# Patient Record
Sex: Male | Born: 1970 | Race: White | Hispanic: No | Marital: Married | Smoking: Current every day smoker
Health system: Southern US, Community
[De-identification: ages and names within clinical notes are randomized; demographics above are authoritative.]

## PROBLEM LIST (undated history)

## (undated) DIAGNOSIS — R55 Syncope and collapse: Secondary | ICD-10-CM

## (undated) DIAGNOSIS — R569 Unspecified convulsions: Secondary | ICD-10-CM

## (undated) DIAGNOSIS — G2581 Restless legs syndrome: Secondary | ICD-10-CM

## (undated) DIAGNOSIS — R001 Bradycardia, unspecified: Secondary | ICD-10-CM

## (undated) DIAGNOSIS — I10 Essential (primary) hypertension: Secondary | ICD-10-CM

## (undated) DIAGNOSIS — Z720 Tobacco use: Secondary | ICD-10-CM

## (undated) HISTORY — PX: OTHER SURGICAL HISTORY: SHX169

---

## 2013-07-18 ENCOUNTER — Encounter (HOSPITAL_COMMUNITY): Payer: Self-pay | Admitting: Emergency Medicine

## 2013-07-18 ENCOUNTER — Inpatient Hospital Stay (HOSPITAL_COMMUNITY)
Admission: EM | Admit: 2013-07-18 | Discharge: 2013-07-22 | DRG: 312 | Disposition: A | Discharge status: Home / Self Care | Payer: BC Managed Care – PPO | Attending: Internal Medicine | Admitting: Internal Medicine

## 2013-07-18 ENCOUNTER — Observation Stay (HOSPITAL_COMMUNITY): Payer: BC Managed Care – PPO

## 2013-07-18 ENCOUNTER — Emergency Department (HOSPITAL_COMMUNITY): Payer: BC Managed Care – PPO

## 2013-07-18 DIAGNOSIS — Z833 Family history of diabetes mellitus: Secondary | ICD-10-CM

## 2013-07-18 DIAGNOSIS — G2581 Restless legs syndrome: Secondary | ICD-10-CM | POA: Diagnosis present

## 2013-07-18 DIAGNOSIS — R109 Unspecified abdominal pain: Secondary | ICD-10-CM | POA: Diagnosis present

## 2013-07-18 DIAGNOSIS — I10 Essential (primary) hypertension: Secondary | ICD-10-CM

## 2013-07-18 DIAGNOSIS — I519 Heart disease, unspecified: Secondary | ICD-10-CM | POA: Diagnosis present

## 2013-07-18 DIAGNOSIS — R569 Unspecified convulsions: Secondary | ICD-10-CM

## 2013-07-18 DIAGNOSIS — R4789 Other speech disturbances: Secondary | ICD-10-CM

## 2013-07-18 DIAGNOSIS — W19XXXA Unspecified fall, initial encounter: Secondary | ICD-10-CM | POA: Diagnosis present

## 2013-07-18 DIAGNOSIS — R4182 Altered mental status, unspecified: Secondary | ICD-10-CM

## 2013-07-18 DIAGNOSIS — I517 Cardiomegaly: Secondary | ICD-10-CM

## 2013-07-18 DIAGNOSIS — G4733 Obstructive sleep apnea (adult) (pediatric): Secondary | ICD-10-CM

## 2013-07-18 DIAGNOSIS — F17210 Nicotine dependence, cigarettes, uncomplicated: Secondary | ICD-10-CM | POA: Diagnosis present

## 2013-07-18 DIAGNOSIS — Z7982 Long term (current) use of aspirin: Secondary | ICD-10-CM

## 2013-07-18 DIAGNOSIS — Z8249 Family history of ischemic heart disease and other diseases of the circulatory system: Secondary | ICD-10-CM

## 2013-07-18 DIAGNOSIS — F17200 Nicotine dependence, unspecified, uncomplicated: Secondary | ICD-10-CM | POA: Diagnosis present

## 2013-07-18 DIAGNOSIS — Z79899 Other long term (current) drug therapy: Secondary | ICD-10-CM

## 2013-07-18 DIAGNOSIS — S022XXA Fracture of nasal bones, initial encounter for closed fracture: Secondary | ICD-10-CM

## 2013-07-18 DIAGNOSIS — R404 Transient alteration of awareness: Secondary | ICD-10-CM | POA: Diagnosis present

## 2013-07-18 DIAGNOSIS — R55 Syncope and collapse: Principal | ICD-10-CM | POA: Diagnosis present

## 2013-07-18 DIAGNOSIS — Z823 Family history of stroke: Secondary | ICD-10-CM

## 2013-07-18 DIAGNOSIS — S022XXB Fracture of nasal bones, initial encounter for open fracture: Secondary | ICD-10-CM

## 2013-07-18 DIAGNOSIS — G9389 Other specified disorders of brain: Secondary | ICD-10-CM | POA: Diagnosis present

## 2013-07-18 DIAGNOSIS — I498 Other specified cardiac arrhythmias: Secondary | ICD-10-CM | POA: Diagnosis present

## 2013-07-18 HISTORY — DX: Bradycardia, unspecified: R00.1

## 2013-07-18 HISTORY — DX: Tobacco use: Z72.0

## 2013-07-18 HISTORY — DX: Syncope and collapse: R55

## 2013-07-18 HISTORY — DX: Morbid (severe) obesity due to excess calories: E66.01

## 2013-07-18 HISTORY — DX: Unspecified convulsions: R56.9

## 2013-07-18 HISTORY — DX: Essential (primary) hypertension: I10

## 2013-07-18 HISTORY — DX: Restless legs syndrome: G25.81

## 2013-07-18 LAB — LIPASE, BLOOD: Lipase: 26 U/L (ref 11–59)

## 2013-07-18 LAB — URINALYSIS, ROUTINE W REFLEX MICROSCOPIC
Bilirubin Urine: NEGATIVE
Glucose, UA: NEGATIVE mg/dL
Hgb urine dipstick: NEGATIVE
Leukocytes, UA: NEGATIVE
Specific Gravity, Urine: 1.031 — ABNORMAL HIGH (ref 1.005–1.030)
Urobilinogen, UA: 1 mg/dL (ref 0.0–1.0)
pH: 6.5 (ref 5.0–8.0)

## 2013-07-18 LAB — COMPREHENSIVE METABOLIC PANEL
BUN: 15 mg/dL (ref 6–23)
CO2: 24 mEq/L (ref 19–32)
Calcium: 8.7 mg/dL (ref 8.4–10.5)
Chloride: 103 mEq/L (ref 96–112)
Creatinine, Ser: 0.85 mg/dL (ref 0.50–1.35)
GFR calc Af Amer: >90 mL/min (ref >90)
GFR calc non Af Amer: >90 mL/min (ref >90)
Glucose, Bld: 105 mg/dL — ABNORMAL HIGH (ref 70–99)
Sodium: 138 mEq/L (ref 135–145)
Total Bilirubin: 0.6 mg/dL (ref 0.3–1.2)

## 2013-07-18 LAB — DIFFERENTIAL
Eosinophils Relative: 3 % (ref 0–5)
Lymphocytes Relative: 23 % (ref 12–46)
Monocytes Absolute: 0.8 10*3/uL (ref 0.1–1.0)
Monocytes Relative: 10 % (ref 3–12)
Neutro Abs: 4.9 10*3/uL (ref 1.7–7.7)
Neutrophils Relative %: 64 % (ref 43–77)

## 2013-07-18 LAB — CBC
HCT: 46.8 % (ref 39.0–52.0)
Hemoglobin: 16 g/dL (ref 13.0–17.0)
MCV: 88.3 fL (ref 78.0–100.0)
RDW: 13 % (ref 11.5–15.5)
WBC: 7.6 10*3/uL (ref 4.0–10.5)

## 2013-07-18 LAB — TROPONIN I
Troponin I: <0.3 ng/mL (ref <0.30)
Troponin I: <0.3 ng/mL (ref <0.30)
Troponin I: <0.3 ng/mL (ref <0.30)

## 2013-07-18 LAB — POCT I-STAT TROPONIN I: Troponin i, poc: 0 ng/mL (ref 0.00–0.08)

## 2013-07-18 LAB — PROTIME-INR
INR: 0.99 (ref 0.00–1.49)
Prothrombin Time: 12.9 seconds (ref 11.6–15.2)

## 2013-07-18 LAB — POCT I-STAT, CHEM 8
Chloride: 103 mEq/L (ref 96–112)
Creatinine, Ser: 0.9 mg/dL (ref 0.50–1.35)
Glucose, Bld: 108 mg/dL — ABNORMAL HIGH (ref 70–99)
Hemoglobin: 16.3 g/dL (ref 13.0–17.0)
Potassium: 4 mEq/L (ref 3.5–5.1)
Sodium: 139 mEq/L (ref 135–145)

## 2013-07-18 LAB — GLUCOSE, CAPILLARY: Glucose-Capillary: 99 mg/dL (ref 70–99)

## 2013-07-18 LAB — HEMOGLOBIN A1C
Hgb A1c MFr Bld: 5.7 % — ABNORMAL HIGH (ref <5.7)
Mean Plasma Glucose: 117 mg/dL — ABNORMAL HIGH (ref <117)

## 2013-07-18 LAB — ETHANOL: Alcohol, Ethyl (B): <11 mg/dL (ref 0–11)

## 2013-07-18 LAB — APTT: aPTT: 28 seconds (ref 24–37)

## 2013-07-18 LAB — RAPID URINE DRUG SCREEN, HOSP PERFORMED
Cocaine: NOT DETECTED
Opiates: NOT DETECTED
Tetrahydrocannabinol: NOT DETECTED

## 2013-07-18 LAB — D-DIMER, QUANTITATIVE: D-Dimer, Quant: 0.83 ug/mL-FEU — ABNORMAL HIGH (ref 0.00–0.48)

## 2013-07-18 MED ORDER — LEVETIRACETAM 500 MG PO TABS
500.0000 mg | ORAL_TABLET | Freq: Two times a day (BID) | ORAL | Status: DC
  Filled 2013-07-18 (×2): qty 1

## 2013-07-18 MED ORDER — PANTOPRAZOLE SODIUM 40 MG PO TBEC
40.0000 mg | DELAYED_RELEASE_TABLET | Freq: Every day | ORAL | Status: DC
  Administered 2013-07-19 – 2013-07-22 (×4): 40 mg via ORAL
  Filled 2013-07-18 (×3): qty 1

## 2013-07-18 MED ORDER — HYDROMORPHONE HCL PF 1 MG/ML IJ SOLN: 1.0000 mg | Freq: Once | INTRAMUSCULAR | Status: DC

## 2013-07-18 MED ORDER — IOHEXOL 300 MG/ML  SOLN
100.0000 mL | Freq: Once | INTRAMUSCULAR | Status: AC | PRN
  Administered 2013-07-18: 100 mL via INTRAVENOUS

## 2013-07-18 MED ORDER — LEVETIRACETAM 500 MG/5ML IV SOLN
1000.0000 mg | INTRAVENOUS | Status: AC
  Administered 2013-07-18: 1000 mg via INTRAVENOUS
  Filled 2013-07-18: qty 10

## 2013-07-18 MED ORDER — WHITE PETROLATUM GEL
Status: AC
  Administered 2013-07-18: 0.2
  Filled 2013-07-18: qty 5

## 2013-07-18 MED ORDER — ONDANSETRON HCL 4 MG/2ML IJ SOLN
4.0000 mg | Freq: Once | INTRAMUSCULAR | Status: AC
  Administered 2013-07-18: 4 mg via INTRAVENOUS
  Filled 2013-07-18: qty 2

## 2013-07-18 MED ORDER — KETOROLAC TROMETHAMINE 30 MG/ML IJ SOLN
30.0000 mg | Freq: Once | INTRAMUSCULAR | Status: AC
  Administered 2013-07-18: 30 mg via INTRAVENOUS
  Filled 2013-07-18: qty 1

## 2013-07-18 MED ORDER — ASPIRIN EC 81 MG PO TBEC
81.0000 mg | DELAYED_RELEASE_TABLET | Freq: Every day | ORAL | Status: DC
  Administered 2013-07-19 – 2013-07-22 (×4): 81 mg via ORAL
  Filled 2013-07-18 (×5): qty 1

## 2013-07-18 MED ORDER — MORPHINE SULFATE 2 MG/ML IJ SOLN
2.0000 mg | INTRAMUSCULAR | Status: DC | PRN
  Administered 2013-07-18 – 2013-07-22 (×23): 2 mg via INTRAVENOUS
  Filled 2013-07-18 (×23): qty 1

## 2013-07-18 MED ORDER — TETANUS-DIPHTH-ACELL PERTUSSIS 5-2.5-18.5 LF-MCG/0.5 IM SUSP
0.5000 mL | Freq: Once | INTRAMUSCULAR | Status: AC
  Administered 2013-07-18: 0.5 mL via INTRAMUSCULAR
  Filled 2013-07-18: qty 0.5

## 2013-07-18 MED ORDER — LEVETIRACETAM 750 MG PO TABS
750.0000 mg | ORAL_TABLET | Freq: Two times a day (BID) | ORAL | Status: DC
  Administered 2013-07-18 – 2013-07-19 (×2): 750 mg via ORAL
  Filled 2013-07-18 (×2): qty 1

## 2013-07-18 NOTE — Code Documentation (Signed)
42 yo wm brought in via [facility] for s/p fall & slurred speech.  Per report pt was smoking at work with a coworker when he had a witnessed fall from a seated position onto his face.  Pt was awake by EMS arrival with slurred speech & facial trauma.  On arrival to ED pt was alert & able to follow commands & MAE.  Pt rapidly improved in the scanner & was able to answer questions MAEE. NIH 3 for mild aphasia.  Pt is with complaints of abd pain & has a remote hx of seizures.  No acute treatment due to mild & improving symptoms.  See docflowsheets for times

## 2013-07-18 NOTE — ED Notes (Signed)
Spoke with wife and she requested that pt not be given narcotics (per pt request) d/t drug abuse in the past.

## 2013-07-18 NOTE — ED Notes (Signed)
Pt is trying to give a urine sample at this time

## 2013-07-18 NOTE — Procedures (Signed)
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   Sedation: None  Background: The preponderance of this study is drowsiness and sleep. Sleep structures appear normal and symmetrical. There are frequent positive occipital sharp transients of sleep(POSTS) that are a normal variant. Once roused, There is a well defined posterior dominant rhythm of 9 Hz.  Photic stimulation: Physiologic driving is not performed  EEG Abnormalities: None  Clinical Interpretation: This normal EEG is recorded in the waking and sleep state. There was no seizure or seizure predisposition recorded on this study.   Ritta Slot, MD [facility] [phone]  If 7pm- 7am, please page neurology on call at [phone].

## 2013-07-18 NOTE — Progress Notes (Signed)
VASCULAR LAB PRELIMINARY  PRELIMINARY  PRELIMINARY  PRELIMINARY  Carotid duplex  completed.    Preliminary report:  Bilateral:  1-39% ICA stenosis.  Vertebral artery flow is antegrade.      Xander Jutras, RVT 07/18/2013, 10:38 AM
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
## 2013-07-18 NOTE — ED Provider Notes (Signed)
CSN: [id number]     Arrival date & time 07/18/13  4098 History   First MD Initiated Contact with Patient 07/18/13 [phone]     Chief Complaint  Patient presents with  . Code Stroke   (Consider location/radiation/quality/duration/timing/severity/associated sxs/prior Treatment) HPI Comments: At work and fall. Possible seizure. No history provided by patient.   Patient is a 42 y.o. male presenting with fall. The history is provided by the patient.  Fall This is a new problem. The current episode started 1 to 2 hours ago. The problem occurs constantly. The problem has not changed since onset.Associated symptoms include abdominal pain (since yesterday) and headaches (since yesterday). Pertinent negatives include no chest pain and no shortness of breath. Nothing aggravates the symptoms. Nothing relieves the symptoms. He has tried nothing for the symptoms.    History reviewed. No pertinent past medical history. History reviewed. No pertinent past surgical history. No family history on file. History  Substance Use Topics  . Smoking status: Not on file  . Smokeless tobacco: Not on file  . Alcohol Use: Not on file    Review of Systems  Constitutional: Negative for fever.  Respiratory: Negative for shortness of breath.   Cardiovascular: Negative for chest pain.  Gastrointestinal: Positive for abdominal pain (since yesterday).  Neurological: Positive for headaches (since yesterday).  All other systems reviewed and are negative.    Allergies  Review of patient's allergies indicates not on file.  Home Medications  No current outpatient prescriptions on file. BP 150/105  Pulse 69  Temp(Src) 98.1 F (36.7 C) (Oral)  Resp 26  SpO2 97% Physical Exam  Nursing note and vitals reviewed. Constitutional: He is oriented to person, place, and time. He appears well-developed and well-nourished. No distress.  HENT:  Head: Normocephalic.  Nose: Sinus tenderness present. No septal deviation or  nasal septal hematoma. No epistaxis.  No foreign bodies. Right sinus exhibits maxillary sinus tenderness and frontal sinus tenderness. Left sinus exhibits frontal sinus tenderness.    Mouth/Throat: No oropharyngeal exudate.  Eyes: EOM are normal. Pupils are equal, round, and reactive to light.  Neck: Normal range of motion. Neck supple.  Cardiovascular: Normal rate and regular rhythm.  Exam reveals no friction rub.   No murmur heard. Pulmonary/Chest: Effort normal and breath sounds normal. No respiratory distress. He has no wheezes. He has no rales.  Abdominal: Soft. He exhibits no distension. There is tenderness (epigastric). There is no rebound.  Musculoskeletal: Normal range of motion. He exhibits no edema.  Neurological: He is alert and oriented to person, place, and time. No cranial nerve deficit. He exhibits normal muscle tone. Coordination normal.  Skin: Skin is dry. He is not diaphoretic. No erythema.    ED Course  Procedures (including critical care time) Labs Review Labs Reviewed  POCT I-STAT, CHEM 8 - Abnormal; Notable for the following:    Glucose, Bld 108 (*)    All other components within normal limits  PROTIME-INR  APTT  CBC  DIFFERENTIAL  ETHANOL  COMPREHENSIVE METABOLIC PANEL  TROPONIN I  URINE RAPID DRUG SCREEN (HOSP PERFORMED)  URINALYSIS, ROUTINE W REFLEX MICROSCOPIC  LIPASE, BLOOD  POCT I-STAT TROPONIN I   Imaging Review No results found.  EKG Interpretation    Date/Time:  Wednesday July 18 2013 06:56:45 EST Ventricular Rate:  73 PR Interval:  178 QRS Duration: 97 QT Interval:  371 QTC Calculation: 409 R Axis:   59 Text Interpretation:  Sinus rhythm Borderline T abnormalities, inferior leads No prior EKG to  compare Confirmed by [facility]  MD, Mavrick Mcquigg ([id number]) on 07/18/2013 8:40:42 AM            MDM   1. Syncope    89M s/p possible syncopal vs seizure episode. He fell onto his face. He head unilateral weakness and slurred speech, thus was  made a Code Stroke.  Patient reports abdominal pain, epigastric,sharp, nonradiating, for the past day. He has also had a headache for the past day.  Stroke team had evaluated patient upon my arrival. They believe he is a NIH stroke scale of 3-4 initially that is improving, but with possible seizure/syncope with this, no intervention is required at this time. He reports a seizure previously, but isn't taking any medications at this time and hasn't for years because he felt the meds made him have seizures. AFVSS here. Relaxing comfortably. Epigastric pain on exam, no rebound or guarding, no other abdominal pain. Large abrasion over nose. Midface stable, but tender.  Will scan face, neck, head. Labs sent. Will send for CT Abd/Pelvis. Neuro wants to admit for possible TIA/seizure workup.  CT Face shows nasal fractures.    Dagmar Hait, MD 07/18/13 [phone]

## 2013-07-18 NOTE — Progress Notes (Signed)
Called at 1645 to assist with patient with period of unresponsivness.  On arrival patient alert - oriented - mae - slight right side weakness - otherwise at neuro baseline.  Wife states he was eating dinner and suddenly placed fork on bed - said he needed to rest - then had blank stare and fell to right side rail - again striking nose on side rail.  No signs of any tonic-clonic movemnet noted. Episode lasted about one and half minutes.  No signs incontinence.  Patient does not remember the episode.  CBG 99 BP 116/75 HR 68 SR RR 20 O2sat 100% on 2 liter nasal cannula.  Denies CP or SOB.  Patient admitted early this AM with syncopal episode at work where he fell to the ground face forward - has nasal fx and open wounds to face.  Dr. Amada Jupiter present.  Handoff to [employer] - to call as needed.  No RRT intervention needed at this time.

## 2013-07-18 NOTE — ED Notes (Signed)
Per EMS, Patient was sitting outside of work smoking a cigarette with coworkers and stopped taking mid sentence and had an episode of syncope. Patient fell and hit his head and has a laceration to the bridge of his nose. Upon EMS arrival patient was noted to have unilateral weakness and slurred speech. Patient had 1 episode of emesis. LSN 0550 CBG 95

## 2013-07-18 NOTE — Progress Notes (Signed)
Routine EEG completed.  

## 2013-07-18 NOTE — ED Notes (Addendum)
Spoke with RN Cala Bradford and she is aware that pt is at vascular and is going to EEG/MRI after vascular and before he is going to floor.  Spouse is aware.  Meds taken up by staff and handed to [employer].
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
## 2013-07-18 NOTE — Progress Notes (Signed)
Echo Lab  2D Echocardiogram completed.  Amariona Rathje L Anastasio Wogan, RDCS 07/18/2013 11:22 AM

## 2013-07-18 NOTE — Consult Note (Signed)
Referring Physician: ED    Chief Complaint: CONFUSION, SLURRED SPEECH, RIGHT FACE WEAKNESS, RIGHT GREATER THAN LEFT ARM WEAKNESS.  HPI:                                                                                                                                         Caleb Rodriguez is an 42 y.o. male right handed, with a past medical history significant for HTN, single seizure many years ago, smoker, brought to [facility] ED by ambulance as a code stroke due to the above stated symptoms. He wast last known by a coworker well at 520 am. As per EMS, patient was at work in [employer], took a break and went outside to smoke in company of a Radio broadcast assistant and then he collapsed to the floor and hit his face. Upon EMS arrival he was noted to be confused with slurred speech, right face weakness, and weakness of both arms, right greater than left. He has no recollection of this event. Initial NIHSS 3 in the ED. CT brain showed no acute ischemic or hemorrhagic abnormality but both MCA are mildly hyperdense. [name] started to improved rapidly in the ED but still disoriented to place-year-month. Denies HA, vertigo, double vision, difficulty swallowing, or visual disturbances. Date last known well: 07/18/13 Time last known well: 520 am  tPA Given: No, low NIHSS, concern for seizure. NIHSS: 3  MRS: 0  History reviewed. No pertinent past medical history.  History reviewed. No pertinent past surgical history.  No family history on file. Social History:  has no tobacco, alcohol, and drug history on file.  Allergies: Not on File  Medications:                                                                                                                           I have reviewed the patient's current medications.  ROS:  History obtained from chart  review  General ROS: negative for - chills, fatigue, fever, night sweats, weight gain or weight loss Psychological ROS: negative for - behavioral disorder, hallucinations, memory difficulties, mood swings or suicidal ideation Ophthalmic ROS: negative for - blurry vision, double vision, eye pain or loss of vision ENT ROS: negative for - epistaxis, nasal discharge, oral lesions, sore throat, tinnitus or vertigo Allergy and Immunology ROS: negative for - hives or itchy/watery eyes Hematological and Lymphatic ROS: negative for - bleeding problems, bruising or swollen lymph nodes Endocrine ROS: negative for - galactorrhea, hair pattern changes, polydipsia/polyuria or temperature intolerance Respiratory ROS: negative for - cough, hemoptysis, shortness of breath or wheezing Cardiovascular ROS: negative for - chest pain, dyspnea on exertion, edema or irregular heartbeat Gastrointestinal ROS: negative for - abdominal pain, diarrhea, hematemesis, nausea/vomiting or stool incontinence Genito-Urinary ROS: negative for - dysuria, hematuria, incontinence or urinary frequency/urgency Musculoskeletal ROS: negative for - joint swelling or muscular weakness Neurological ROS: as noted in HPI Dermatological ROS: negative for rash and skin lesion changes   Physical exam: pleasant male in no apparent distress. Blood pressure 150/105, pulse 69, temperature 98.1 F (36.7 C), temperature source Oral, resp. rate 26, SpO2 97.00%. Head: normocephalic.  Face: significant face trauma. Neck: supple, no bruits, no JVD. Cardiac: no murmurs. Lungs: clear. Abdomen: soft, no tender, no mass. Extremities: no edema.  Neurologic Examination:                                                                                                      Mental Status: Alert, awake, disoriented to place-year-month. No aphasia or Asir dysarthria.  Able to follow 3 step commands without difficulty. Cranial Nerves: II: Discs flat  bilaterally; Visual fields grossly normal, pupils equal, round, reactive to light III,IV, VI: ptosis not present, extra-ocular motions intact bilaterally V,VII: smile symmetric, facial light touch sensation normal bilaterally VIII: hearing normal bilaterally IX,X: gag reflex present XI: bilateral shoulder shrug XII: midline tongue extension without atrophy or fasciculations  Motor: Right : Upper extremity   5/5    Left:     Upper extremity   5/5  Lower extremity   5/5     Lower extremity   5/5 Tone and bulk:normal tone throughout; no atrophy noted Sensory: Pinprick and light touch intact throughout, bilaterally Deep Tendon Reflexes:  Right: Upper Extremity   Left: Upper extremity   biceps (C-5 to C-6) 2/4   biceps (C-5 to C-6) 2/4 tricep (C7) 2/4    triceps (C7) 2/4 Brachioradialis (C6) 2/4  Brachioradialis (C6) 2/4  Lower Extremity Lower Extremity  quadriceps (L-2 to L-4) 2/4   quadriceps (L-2 to L-4) 2/4 Achilles (S1) 2/4   Achilles (S1) 2/4  Plantars: Right: downgoing   Left: downgoing Cerebellar: normal finger-to-nose,  normal heel-to-shin test Gait:  No tested. CV: pulses palpable throughout    Results for orders placed during the hospital encounter of 07/18/13 (from the past 48 hour(s))  POCT I-STAT TROPONIN I     Status: None   Collection Time    07/18/13  6:32 AM  Result Value Range   Troponin i, poc 0.00  0.00 - 0.08 ng/mL   Comment 3            Comment: Due to the release kinetics of cTnI,     a negative result within the first hours     of the onset of symptoms does not rule out     myocardial infarction with certainty.     If myocardial infarction is still suspected,     repeat the test at appropriate intervals.  PROTIME-INR     Status: None   Collection Time    07/18/13  6:34 AM      Result Value Range   Prothrombin Time 12.9  11.6 - 15.2 seconds   INR 0.99  0.00 - 1.49  APTT     Status: None   Collection Time    07/18/13  6:34 AM      Result  Value Range   aPTT 28  24 - 37 seconds  CBC     Status: None   Collection Time    07/18/13  6:34 AM      Result Value Range   WBC 7.6  4.0 - 10.5 K/uL   RBC 5.30  4.22 - 5.81 MIL/uL   Hemoglobin 16.0  13.0 - 17.0 g/dL   HCT 65.7  84.6 - 96.2 %   MCV 88.3  78.0 - 100.0 fL   MCH 30.2  26.0 - 34.0 pg   MCHC 34.2  30.0 - 36.0 g/dL   RDW 95.2  84.1 - 32.4 %   Platelets 167  150 - 400 K/uL  DIFFERENTIAL     Status: None   Collection Time    07/18/13  6:34 AM      Result Value Range   Neutrophils Relative % 64  43 - 77 %   Neutro Abs 4.9  1.7 - 7.7 K/uL   Lymphocytes Relative 23  12 - 46 %   Lymphs Abs 1.7  0.7 - 4.0 K/uL   Monocytes Relative 10  3 - 12 %   Monocytes Absolute 0.8  0.1 - 1.0 K/uL   Eosinophils Relative 3  0 - 5 %   Eosinophils Absolute 0.2  0.0 - 0.7 K/uL   Basophils Relative 0  0 - 1 %   Basophils Absolute 0.0  0.0 - 0.1 K/uL  POCT I-STAT, CHEM 8     Status: Abnormal   Collection Time    07/18/13  6:34 AM      Result Value Range   Sodium 139  135 - 145 mEq/L   Comment: QA FLAGS AND/OR RANGES MODIFIED BY DEMOGRAPHIC UPDATE ON 12/24 AT 4010   Potassium 4.0  3.5 - 5.1 mEq/L   Comment: QA FLAGS AND/OR RANGES MODIFIED BY DEMOGRAPHIC UPDATE ON 12/24 AT 0634   Chloride 103  96 - 112 mEq/L   Comment: QA FLAGS AND/OR RANGES MODIFIED BY DEMOGRAPHIC UPDATE ON 12/24 AT 0634   BUN 15  6 - 23 mg/dL   Comment: QA FLAGS AND/OR RANGES MODIFIED BY DEMOGRAPHIC UPDATE ON 12/24 AT 2725   Creatinine, Ser 0.90  0.50 - 1.35 mg/dL   Comment: QA FLAGS AND/OR RANGES MODIFIED BY DEMOGRAPHIC UPDATE ON 12/24 AT 0634   Glucose, Bld 108 (*) 70 - 99 mg/dL   Comment: QA FLAGS AND/OR RANGES MODIFIED BY DEMOGRAPHIC UPDATE ON 12/24 AT 3664   Calcium, Ion 1.15  1.12 - 1.23 mmol/L   Comment: QA FLAGS AND/OR RANGES  MODIFIED BY DEMOGRAPHIC UPDATE ON 12/24 AT 0634     QA FLAGS AND/OR RANGES MODIFIED BY DEMOGRAPHIC UPDATE ON 12/24 AT 0643   TCO2 23  0 - 100 mmol/L   Comment: QA FLAGS AND/OR RANGES  MODIFIED BY DEMOGRAPHIC UPDATE ON 12/24 AT 0634   Hemoglobin 16.3  13.0 - 17.0 g/dL   Comment: QA FLAGS AND/OR RANGES MODIFIED BY DEMOGRAPHIC UPDATE ON 12/24 AT 0634   HCT 48.0  39.0 - 52.0 %   Comment: QA FLAGS AND/OR RANGES MODIFIED BY DEMOGRAPHIC UPDATE ON 12/24 AT 0634   No results found.    Assessment: 42 y.o. male brought in after collapsing at work and found to be confused afterwards, with slurred speech, right face weakness and right greater than left weakness. Initial NIHSS 3 but steadily improving. CT brain showed no acute stroke but both MCA are mildly hyperdense.  Has a history of a remote isolated seizure and can not exclude the possibility of a seizure tonight. Bilateral hyperdense MCA but NIHSS 3 don't make me think he had a hemispheric stroke in the context of major intracranial arterial involvement. Will complete stroke and seizure work up. Load with 1 gram IV keppra and continue keppra 500 mg BID thereafter. Will follow up.  Stroke Risk Factors - smoking, HTN  Wyatt Portela ,MD [facility] [phone]  07/18/2013, 7:05 AM

## 2013-07-19 ENCOUNTER — Inpatient Hospital Stay (HOSPITAL_COMMUNITY): Payer: BC Managed Care – PPO

## 2013-07-19 LAB — COMPREHENSIVE METABOLIC PANEL
Albumin: 3.2 g/dL — ABNORMAL LOW (ref 3.5–5.2)
BUN: 12 mg/dL (ref 6–23)
Calcium: 8.5 mg/dL (ref 8.4–10.5)
Chloride: 100 mEq/L (ref 96–112)
Creatinine, Ser: 0.79 mg/dL (ref 0.50–1.35)
GFR calc Af Amer: >90 mL/min (ref >90)
GFR calc non Af Amer: >90 mL/min (ref >90)
Glucose, Bld: 105 mg/dL — ABNORMAL HIGH (ref 70–99)
Total Bilirubin: 0.6 mg/dL (ref 0.3–1.2)
Total Protein: 6.1 g/dL (ref 6.0–8.3)

## 2013-07-19 LAB — CBC
Hemoglobin: 14.9 g/dL (ref 13.0–17.0)
MCV: 87.9 fL (ref 78.0–100.0)
Platelets: 171 10*3/uL (ref 150–400)
RBC: 4.96 MIL/uL (ref 4.22–5.81)
WBC: 7.8 10*3/uL (ref 4.0–10.5)

## 2013-07-19 LAB — PROTIME-INR
INR: 1.03 (ref 0.00–1.49)
Prothrombin Time: 13.3 seconds (ref 11.6–15.2)

## 2013-07-19 LAB — APTT: aPTT: 34 seconds (ref 24–37)

## 2013-07-19 LAB — LIPID PANEL: Cholesterol: 197 mg/dL (ref 0–200)

## 2013-07-19 LAB — OCCULT BLOOD X 1 CARD TO LAB, STOOL: Fecal Occult Bld: NEGATIVE

## 2013-07-19 MED ORDER — KETOROLAC TROMETHAMINE 10 MG PO TABS
10.0000 mg | ORAL_TABLET | Freq: Three times a day (TID) | ORAL | Status: DC
  Administered 2013-07-19 – 2013-07-22 (×9): 10 mg via ORAL
  Filled 2013-07-19 (×14): qty 1

## 2013-07-19 MED ORDER — IOHEXOL 350 MG/ML SOLN
75.0000 mL | Freq: Once | INTRAVENOUS | Status: AC | PRN
  Administered 2013-07-19: 75 mL via INTRAVENOUS

## 2013-07-19 MED ORDER — SODIUM CHLORIDE 0.9 % IV BOLUS (SEPSIS)
500.0000 mL | Freq: Once | INTRAVENOUS | Status: AC
  Administered 2013-07-19: 500 mL via INTRAVENOUS

## 2013-07-19 MED ORDER — LEVETIRACETAM 500 MG PO TABS
1000.0000 mg | ORAL_TABLET | Freq: Two times a day (BID) | ORAL | Status: DC
  Administered 2013-07-19 – 2013-07-21 (×4): 1000 mg via ORAL
  Filled 2013-07-19 (×5): qty 2

## 2013-07-19 NOTE — Progress Notes (Signed)
Internal Medicine Attending  Date: 07/19/2013  Patient name: Caleb Rodriguez Medical record number: [id number] Date of birth: [birth year] Age: 42 y.o. Gender: male  I saw and evaluated the patient on a.m. rounds, and discussed his care with house staff.  He is feeling somewhat better; his strength is better on the right.  CT scan of the chest showed no pulmonary embolism.  I reviewed the resident's note by Dr. Zada Girt and I agree with the resident's findings and plans as documented in his note.

## 2013-07-19 NOTE — H&P (Signed)
Please see attending admission note 07/18/2013.

## 2013-07-19 NOTE — Progress Notes (Signed)
Pt c/o tingling and progresive weakness to rt side of body, blurred vision and intermittent slurring  of speech. Pt re assessed and Dr Lavonna Monarch  Neuro on call notified. New orders written . Pt  Awake  Alert and follows command appropriately.  No seizure episode noted at present . Rn will continue to monitor patient.and maintain seizure precaution.

## 2013-07-19 NOTE — Progress Notes (Signed)
Nurses called into pt room stat as pt suddenly became verbally  unresponsive,  staring and rolling his rt fingers, then knock  off cup of ice water  onto the floor. This happened  Whiles pt was  talking with family members  at the bedside.   lasted for about 1 minute   said patient's wife. This episode was just witnessed by family members. Pt became verbally responsive and oriented to person place when RN's  came in . Vital signs taken ,WNL. MD on call made aware no new orders given.   Will continue to monitor pt

## 2013-07-19 NOTE — Progress Notes (Signed)
Subjective: No further spells  Exam: Filed Vitals:   07/19/13 0547  BP: 111/72  Pulse: 71  Temp: 98.1 F (36.7 C)  Resp: 19   Gen: In bed, NAD MS: Awake, Alert, oriented and appropriate GE:[id number]< EOMI Motor: 5/5 throughout left side and right LE. He has poor effort in RUE, 4/5 Sensory: Reports "tingly" sensation in right arm to touch.   Impression: 42 yo M with syncope as well as speel with right arm automatism and staring yesterday afternoon. 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 Following this, he was taken off of dilantin and had no further episodes x 3 years.   Possibilities at this time include recurrent seizures, syncope with subsequent recurrent psychogenic spells after the idea of seizures is brought up to him, or combination of epilepsy and psychogenic spells( I think that this possibility is very slim given that the description of his initial passing out spell does not sound very convincing for seizure, though he did have a post-ictal state)  Recommendations: 1) Continue Keppra 750mg  BID 2) Syncope workup per primary team.   Ritta Slot, MD [facility] [phone]  If 7pm- 7am, please page neurology on call at [phone].
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
## 2013-07-20 ENCOUNTER — Encounter (HOSPITAL_COMMUNITY): Payer: Self-pay | Admitting: Emergency Medicine

## 2013-07-20 DIAGNOSIS — R569 Unspecified convulsions: Secondary | ICD-10-CM

## 2013-07-20 LAB — BASIC METABOLIC PANEL
BUN: 10 mg/dL (ref 6–23)
Calcium: 8.3 mg/dL — ABNORMAL LOW (ref 8.4–10.5)
Chloride: 103 mEq/L (ref 96–112)
GFR calc Af Amer: >90 mL/min (ref >90)
GFR calc non Af Amer: >90 mL/min (ref >90)
Glucose, Bld: 105 mg/dL — ABNORMAL HIGH (ref 70–99)
Potassium: 3.8 mEq/L (ref 3.5–5.1)
Sodium: 138 mEq/L (ref 135–145)

## 2013-07-20 LAB — CBC
Hemoglobin: 14.3 g/dL (ref 13.0–17.0)
MCH: 29.9 pg (ref 26.0–34.0)
Platelets: 166 10*3/uL (ref 150–400)
RBC: 4.79 MIL/uL (ref 4.22–5.81)
WBC: 7.8 10*3/uL (ref 4.0–10.5)

## 2013-07-20 NOTE — Progress Notes (Signed)
LTM EEG started 

## 2013-07-20 NOTE — Progress Notes (Signed)
Subjective: Two spells since yesterday, one in the afternoon and one around 1 am. Both consisted of staring with right hand automatisms.   Exam: Filed Vitals:   07/20/13 0520  BP: 126/75  Pulse: 103  Temp: 97.6 F (36.4 C)  Resp: 20   Gen: In bed, NAD MS: Awake, Alert, oriented and appropriate ZO:XWRUE, EOMI Motor: 5/5 throughout left side . He has initially poor effort in RUE, intially won't lift it, but when lifted he is able to hold it aloft. He has 4/5 strength in RLE.  Once breakfast arrives, he uses it with good dexterity.  Sensory: Reports "tingly" sensation in right arm to touch.   Impression: 41 yo M with syncope as well as spells with right arm automatism and staring. 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 Following this, he was taken off of dilantin and had no further episodes x 3 years.   Possibilities at this time include recurrent seizures, syncope with subsequent recurrent psychogenic spells after the idea of seizures is brought up to him, or combination of epilepsy and psychogenic spells( I think that this possibility is very slim given that the description of his initial passing out spell does not sound very convincing for seizure, though he did have a post-ictal state)  Recommendations: 1) Continue Keppra 1000mg  BID 2) Would be very useful to capture one of these episodes on EEG.   Ritta Slot, MD [facility] [phone]  If 7pm- 7am, please page neurology on call at [phone].

## 2013-07-20 NOTE — Progress Notes (Signed)
MD on call for Internal medicine  teaching service  came to see pt but no new orders given.

## 2013-07-20 NOTE — Progress Notes (Signed)
Nurse received a call from CCMD that at 0415 am patient changed from sinus brady heart rate 50-40s  to junctional with heart rate 70. Patient was asleep when aroused patient was alert and oriented x 4 with no complaints of pain/discomfort. Patient stated heart rate normally drops while asleep, and he was sleep apnea as well. Patient continues that he has never worn a C-pap. Notified MD of the change in rhythm, no new orders given at this time. Will continue to monitor patient.

## 2013-07-20 NOTE — Progress Notes (Signed)
UR complete.  Makena Mcgrady RN, MSN 

## 2013-07-20 NOTE — Progress Notes (Signed)
Pt had another episode of seizure which lasted for 1 1/2 minutes. Pt was eating and suddenly stopped and began starring and rolling the rt thumb and index finger per spouse. HOB  lowered and  Pt turned on the rt side as RN walked into the room . RN noted jerking movement of the RUA with vomited food particles around pt.mouth  No bladder nor bowel incontinent noted. Pt open his eye and started talking with speech being  slurred and gabbled,c/o fatigue post ictal .Pt suctioned orally and made comfortable in bed vital signs WNL see DocFowsheet. Slurring of speech resolve five minutes after the episode . Md notified.

## 2013-07-20 NOTE — Progress Notes (Signed)
Nurse assisted patient into bathroom, and back into bed. Nurse left the room and on arriving patient found unresponsive, staring, and rolling right index and thumb together. This lasted for approximately 12 seconds. Vital signs 138/80, 60, 99% on 2.5 L of O 2. Patient started to respond with slurred speech and right side continues to be weaker than left. Approximately 1 minute later patient speech clear, and orientation x 4. Notified neurologist Roseanne Reno, MD- no new orders given at this time. Will continue to monitor patient.

## 2013-07-20 NOTE — Progress Notes (Signed)
Subjective: No complaints this morning from Patient but overnight, while asleep, pt had episodes of bradycardia and junctional rthythm, but remained completely asymptomatic. Also noted by nurse at 4.30pm yesterday and 1.56pm yesterday, pt had some episodes where he was unresponsive, staring, rolling right index and thumb together. Wife says pt has been having some periods of not breathing while asleep, and he makes a lot of snoring noise while sleeping. She has noticed this since they got married a year ago. Pt says though that he doesn't feel tired when he wakes up in the morning, but falls asleep a lot while watching TV, but not while driving.   Objective: Vital signs in last 24 hours: Filed Vitals:   07/19/13 2030 07/20/13 0045 07/20/13 0140 07/20/13 0520  BP: 135/75 115/72 138/80 126/75  Pulse: 57 61 60 103  Temp: 98.9 F (37.2 C) 97.5 F (36.4 C)  97.6 F (36.4 C)  TempSrc: Oral Oral  Oral  Resp: 20 18  20   Weight:    264 lb 5.3 oz (119.9 kg)  SpO2: 97% 94% 99% 97%   Weight change:   Intake/Output Summary (Last 24 hours) at 07/20/13 1208 Last data filed at 07/20/13 0900  Gross per 24 hour  Intake    900 ml  Output    326 ml  Net    574 ml   General appearance: alert, cooperative, appears stated age and no distress, eating breakfast. Head: Normocephalic, without obvious abnormality Nose: Nose appears reddish from previous trauma, Nares normal. Septum appears midline. Mucosa normal. No drainage or sinus tenderness. Lungs: clear to auscultation bilaterally Heart: regular rate and rhythm, S1, S2 normal, no murmur, click, rub or gallop Abdomen: soft, minimal tenderness; bowel sounds normal; no masses,  no organomegaly Extremities: extremities normal, atraumatic, no cyanosis or edema Pulses: 2+ and symmetric Neurologic- Mild weakness on the right side, but finger to nose test and rapidly alternating movement normal on the Rt.  Lab Results: Basic Metabolic Panel:  Recent  Labs Lab 07/19/13 0650 07/20/13 0355  NA 136 138  K 3.7 3.8  CL 100 103  CO2 24 24  GLUCOSE 105* 105*  BUN 12 10  CREATININE 0.79 0.88  CALCIUM 8.5 8.3*   Liver Function Tests:  Recent Labs Lab 07/18/13 0634 07/19/13 0650  AST 19 14  ALT 31 23  ALKPHOS 81 75  BILITOT 0.6 0.6  PROT 6.6 6.1  ALBUMIN 3.7 3.2*    Recent Labs Lab 07/18/13 0634  LIPASE 26   CBC:  Recent Labs Lab 07/18/13 0634 07/19/13 0650 07/20/13 0355  WBC 7.6 7.8 7.8  NEUTROABS 4.9  --   --   HGB 16.0  16.3 14.9 14.3  HCT 46.8  48.0 43.6 42.2  MCV 88.3 87.9 88.1  PLT 167 171 166   Cardiac Enzymes:  Recent Labs Lab 07/18/13 1653 07/18/13 2310 07/19/13 0650  TROPONINI <0.30 <0.30 <0.30   D-Dimer:  Recent Labs Lab 07/18/13 1935  DDIMER 0.83*   CBG:  Recent Labs Lab 07/18/13 0845 07/18/13 1708  GLUCAP 87 99   Hemoglobin A1C:  Recent Labs Lab 07/18/13 0634  HGBA1C 5.7*   Fasting Lipid Panel:  Recent Labs Lab 07/19/13 0650  CHOL 197  HDL 29*  LDLCALC 122*  TRIG 228*  CHOLHDL 6.8   Coagulation:  Recent Labs Lab 07/18/13 0634 07/19/13 0650  LABPROT 12.9 13.3  INR 0.99 1.03   Urine Drug Screen: Drugs of Abuse     Component Value Date/Time  LABOPIA NONE DETECTED 07/18/2013 0924   COCAINSCRNUR NONE DETECTED 07/18/2013 0924   LABBENZ NONE DETECTED 07/18/2013 0924   AMPHETMU NONE DETECTED 07/18/2013 0924   THCU NONE DETECTED 07/18/2013 0924   LABBARB NONE DETECTED 07/18/2013 0924    Alcohol Level:  Recent Labs Lab 07/18/13 0634  ETH <11   Urinalysis:  Recent Labs Lab 07/18/13 0924  COLORURINE YELLOW  LABSPEC 1.031*  PHURINE 6.5  GLUCOSEU NEGATIVE  HGBUR NEGATIVE  BILIRUBINUR NEGATIVE  KETONESUR NEGATIVE  PROTEINUR NEGATIVE  UROBILINOGEN 1.0  NITRITE NEGATIVE  LEUKOCYTESUR NEGATIVE   Micro Results: No results found for this or any previous visit (from the past 240 hour(s)). Studies/Results: Ct Angio Chest Pe W/cm &/or Wo  Cm  07/19/2013   CLINICAL DATA:  Syncope, D-dimer 0.83, hypertension, obesity, smoker  EXAM: CT ANGIOGRAPHY CHEST WITH CONTRAST  TECHNIQUE: Multidetector CT imaging of the chest was performed using the standard protocol during bolus administration of intravenous contrast. Multiplanar CT image reconstructions including MIPs were obtained to evaluate the vascular anatomy.  CONTRAST:  75mL OMNIPAQUE IOHEXOL 350 MG/ML SOLN  COMPARISON:  07/18/2013 chest x-ray  FINDINGS: Enhanced pulmonary arteries appear patent. No significant filling defect or pulmonary embolus by CTA. Mild cardiac enlargement. No pericardial or pleural effusion. No adenopathy. Coronary calcifications noted.  Included upper abdomen demonstrates vicarious contrast excretion in the gallbladder dependently. No acute upper abdominal finding.  Lung windows demonstrate overall low lung volumes with bibasilar atelectasis. No acute airspace process, collapse or consolidation. No edema pattern or CHF. Negative for pneumothorax. Trachea and central airways appear patent.  Degenerative changes noted of the thoracic spine.  Review of the MIP images confirms the above findings.  IMPRESSION: No significant acute pulmonary embolus by CTA.  Low volume chest exam with bibasilar atelectasis   Electronically Signed   By: Ruel Favors M.D.   On: 07/19/2013 11:40   Mr Caleb Rodriguez Head Wo Contrast  07/18/2013   CLINICAL DATA:  Seizure.  Trauma.  EXAM: MRI HEAD WITHOUT CONTRAST  MRA HEAD WITHOUT CONTRAST  TECHNIQUE: Multiplanar, multiecho pulse sequences of the brain and surrounding structures were obtained without intravenous contrast. Angiographic images of the head were obtained using MRA technique without contrast.  COMPARISON:  07/18/2013 head CT.  FINDINGS: MRI HEAD FINDINGS  No acute infarct.  No intracranial hemorrhage.  Asymmetry of the hippocampi without typical findings of mesial temporal sclerosis. On the right, there is a linear band of what appears to be tiny  cystic structures. Significance/etiology indeterminate. This may be an incidental finding. If there are persistent breakthrough seizures, this could be further evaluated on follow-up MR to confirm stability.  No intracranial mass lesion otherwise noted.  No hydrocephalus.  Cervical medullary junction, pituitary region, pineal region and orbital structures unremarkable.  Paranasal sinus mucosal thickening/ partial opacification.  Nasal injury better demonstrated on CT.  MRA HEAD FINDINGS  Ectatic internal carotid artery cervical/petrous segment more notable on the right.  Hypoplastic A1 segment of the A1 segment of the left anterior cerebral artery.  Mild narrowing distal M1 segment left middle cerebral artery.  Tiny bulge right middle cerebral artery bifurcation has an appearance suggesting this represents origin of a vessel rather than an aneurysm.  No significant stenosis or irregularity of either distal vertebral artery or basilar artery.  Non visualization left posterior inferior cerebellar artery.  Poor delineation of the anterior inferior cerebellar arteries.  Narrowed irregular right superior cerebellar artery.  IMPRESSION: No acute infarct.  Slight asymmetry of the hippocampal as detailed  above of questionable significance/ etiology.  Intracranial branch vessel irregularity as noted above.   Electronically Signed   By: Bridgett Larsson M.D.   On: 07/18/2013 14:16   Mr Brain Wo Contrast  07/18/2013   CLINICAL DATA:  Seizure.  Trauma.  EXAM: MRI HEAD WITHOUT CONTRAST  MRA HEAD WITHOUT CONTRAST  TECHNIQUE: Multiplanar, multiecho pulse sequences of the brain and surrounding structures were obtained without intravenous contrast. Angiographic images of the head were obtained using MRA technique without contrast.  COMPARISON:  07/18/2013 head CT.  FINDINGS: MRI HEAD FINDINGS  No acute infarct.  No intracranial hemorrhage.  Asymmetry of the hippocampi without typical findings of mesial temporal sclerosis. On the  right, there is a linear band of what appears to be tiny cystic structures. Significance/etiology indeterminate. This may be an incidental finding. If there are persistent breakthrough seizures, this could be further evaluated on follow-up MR to confirm stability.  No intracranial mass lesion otherwise noted.  No hydrocephalus.  Cervical medullary junction, pituitary region, pineal region and orbital structures unremarkable.  Paranasal sinus mucosal thickening/ partial opacification.  Nasal injury better demonstrated on CT.  MRA HEAD FINDINGS  Ectatic internal carotid artery cervical/petrous segment more notable on the right.  Hypoplastic A1 segment of the A1 segment of the left anterior cerebral artery.  Mild narrowing distal M1 segment left middle cerebral artery.  Tiny bulge right middle cerebral artery bifurcation has an appearance suggesting this represents origin of a vessel rather than an aneurysm.  No significant stenosis or irregularity of either distal vertebral artery or basilar artery.  Non visualization left posterior inferior cerebellar artery.  Poor delineation of the anterior inferior cerebellar arteries.  Narrowed irregular right superior cerebellar artery.  IMPRESSION: No acute infarct.  Slight asymmetry of the hippocampal as detailed above of questionable significance/ etiology.  Intracranial branch vessel irregularity as noted above.   Electronically Signed   By: Bridgett Larsson M.D.   On: 07/18/2013 14:16   Medications: I have reviewed the patient's current medications. Scheduled Meds: . aspirin EC  81 mg Oral Daily  . ketorolac  10 mg Oral Q8H  . levETIRAcetam  1,000 mg Oral BID  . pantoprazole  40 mg Oral Daily   Continuous Infusions:  PRN Meds:.morphine injection Assessment/Plan: Active Problems:   Syncope   Seizure   Hypertension   Morbid obesity   Cigarette smoker two packs a day or less  # Syncope: Etiology still not clear. Differentials include seizure disorder Vs cardiac  arrhythmias in view of abnormal tele findings- bradycardia, with junctional rhythm, possible aggravated by Beta blocker-Bisoprolol, pt was taking for HTN. He was asymptomatic during these episodes. CT, MRI, R/o stroke, CTA r/o PE. Intial EEG normal. 2-D echocardiogram with EF of 55-60% and grade 2 diastolic dysfunction noted. No aortic valve stenosis noted. Carotid Dopplers normal. Orthostatics negative. Considering events since yesterday, Neurology recs are for Continous EEG and video monitoring. Strong consideration for OSA, which can cause increase vagal tone during sleep, and would explain pts bradycardia-as these have all been happening during sleep. Also has never had a sleep study to formally diagnose OSA.  - Sleep study, or consider trial of CPAP machine, but with pts traumatized nose, this would be a challenge. - Cardiovascular etiology can't be excluded in the view of pauses and bradycardia noted on telemetry. Home beta blockers, have been held.   - Avoid any conduction blockade medications upon discharge..  - Consideration of a cardiology consult for possible cardiac monitoring for arrhythmias.  -  Neurology following - Recs appreciated   # Questionable seizures: Hx of seizures, dilantin discontinued 3 years ago by [facility], no records could be gotten from care everywhere. Seizures were thought to be psychogenic in nature. He has been seizure-free since that time. As noted above, EEG has been unremarkable. Initial neurologic fingings likley post-ictal.  - Currently on 1000mg  of keppra BID. - Neuro Checks Q4H.   # HTN- Home meds- Bisoprolol- HCTZ- 5-6.25mg .  - Hold blood pressure meds for now. Will hold BB and CCB with bradycardia episodes and pauses on telemetry  - Other anti-HTns should be considered on discharge- Diuretics and ACE inhibitors.  # Facial Trauma- Seen on CT as Mildly displaced nasal bone fracture  - Talked to ENT surgeon who said since the frcature is not obvious  Visually on looking at the patient, patient should follow up as an out-patient in a week.  - Patient declined oral opioid pain meds, currently on IV morphine- 2mg  Q4H, toradol 10mg  q8h.  # Abdominal pain: likely due to gastritis. Mesenteric ischemia unlikely with normal AG and lactic acid level. He has no risk factors for peritonitis. Abd Ct so far negative for explanatory cause. CMP revealed- normal LFTS, lipase 26.   - cont with PPI- Protonix- 40mg  daily. - FOBT ordered but not done yet. Possibility of GIB is low   # Cigarette- 2 pack /day for the past 26 years.  - Nicotine patch.   Dispo: Disposition is deferred at this time, awaiting improvement of current medical problems.   The patient does have a current PCP (Pcp Not In System) and does need an [facility] hospital follow-up appointment after discharge.  The patient does not know have transportation limitations that hinder transportation to clinic appointments.  .Services Needed at time of discharge: Y = Yes, Blank = No PT:   OT:   RN:   Equipment:   Other:     LOS: 2 days   Kennis Carina, MD 07/20/2013, 12:08 PM

## 2013-07-21 ENCOUNTER — Encounter (HOSPITAL_COMMUNITY): Payer: Self-pay | Admitting: Nurse Practitioner

## 2013-07-21 DIAGNOSIS — S022XXA Fracture of nasal bones, initial encounter for closed fracture: Secondary | ICD-10-CM

## 2013-07-21 DIAGNOSIS — I10 Essential (primary) hypertension: Secondary | ICD-10-CM

## 2013-07-21 DIAGNOSIS — R079 Chest pain, unspecified: Secondary | ICD-10-CM

## 2013-07-21 NOTE — Progress Notes (Addendum)
Subjective: Spell captured on EEG, slumped to the right, some myoclonic appearing jerking. No delta activity to suggest syncope and no cahnge in EEG to sueest seizure.   Exam: Filed Vitals:   07/21/13 0529  BP: 138/89  Pulse: 63  Temp: 97.9 F (36.6 C)  Resp: 22   Gen: In bed, NAD  MS: Awake, Alert, oriented and appropriate  RU:EAVWU, EOMI  Motor: 5/5 throughout left side . He has initially poor effort in RUE, intially won't lift it, but when lifted he is able to hold it aloft. He has 4/5 strength in RLE. Once breakfast arrives, he uses it with good dexterity.  Sensory: Reports "tingly" sensation in right arm to touch.   Impression: 42 yo M with syncope on admission and previous diagnosis of psychogenic non-epileptic spells who has had recurrent episodes of right arm fumbling and unresponsiveness. His spell was captured on EEG with no EEG change. 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 His right sided weakness follows these episodes and has been present to some degree since admission. MRI is negative. He has some inconsistencies on exam suggestive of psychogenic weakness.   The event that brought him in was not liek the ones captured, but rather more suggestive of syncope.   Recommendations: 1) Could consider psychiatry consult given thst patient continues to exhibit spells.  2) will discontinue keppra as all events are similar to the captured event which was not a seizure.  3) will d/c continuous monitoring prior to discharge.   Ritta Slot, MD [facility] [phone]  If 7pm- 7am, please page neurology on call at [phone].

## 2013-07-21 NOTE — Procedures (Addendum)
History: 42 year old male with recurrent episodes of staring with EEG for characterization of spells  This continues EEG recorded from 12/26 10:24 AM until 12/27 10:24 AM  Background: The background consists of intermixed alpha and beta activities. There is a well defined posterior dominant rhythm of 10 Hz that attenuates with eye opening. There is an increase in delta during drowsiness. Normal symmetrical sleep structures were observed.  Photic stimulation: Physiologic driving is absent  EEG Abnormalities: None  Spells: Spell 1: Clinical: Initially patient is lying in bed, he then limits his tongue fall out of his mouth and slumps to the right side with mouth open. He remains in this position for 15-20 seconds and when a trays moved out from under his left arm he falls to the bed. He then has several generalized jerks and begins drooling out of the right side of his mouth. After approximately 2 minutes, he began having nonrhythmic finger rubbing motions of his right hand.  He is turned on his right side by nurses, and subsequently rolled onto his back after approximately 6 minutes, he appears to be having more purposeful movements.  EEG correlate: There is no change in the background EEG. The posterior dominant rhythm is well visualized throughout the entire episode.  Clinical Interpretation: This normal EEG is recorded in the waking and sleep state. There was no seizure or seizure predisposition recorded on this study.   The spell recorded was unassociated with any ictal discharge, nor was it associated with any increase in delta activity to suggest syncope. This EEG is consistent with nonepileptic spells.  Ritta Slot, MD [facility] [phone]  If 7pm- 7am, please page neurology on call at [phone].

## 2013-07-21 NOTE — Progress Notes (Addendum)
Subjective: No complainst today. Had an episode family reported as a seizure yesterday. Pts family was present. Tele monitoring - showed a heart rate of 60s- at around 2 yesterday and an episode of tachycardia- 140s at around 140s this morning. CPAP ordered was not given to patient. Lying in bed comfortably. No difficulty breathing or chest pain.    Objective: Vital signs in last 24 hours: Filed Vitals:   07/21/13 0148 07/21/13 0529 07/21/13 0659 07/21/13 0946  BP: 131/80 138/89  134/94  Pulse: 68 63  59  Temp: 98.2 F (36.8 C) 97.9 F (36.6 C)  97.7 F (36.5 C)  TempSrc: Oral Oral  Oral  Resp: 18 22  20   Weight:   269 lb 13.5 oz (122.4 kg)   SpO2: 95% 93%  97%   Weight change: 5 lb 8.2 oz (2.5 kg)  Intake/Output Summary (Last 24 hours) at 07/21/13 1220 Last data filed at 07/21/13 0915  Gross per 24 hour  Intake    840 ml  Output    240 ml  Net    600 ml   General appearance: Not in any distress, healing bruise on his noise, superficial skin peeled off. Head: Normocephalic, without obvious abnormality Nose: Nose appears reddish from previous trauma, Nares normal. Septum appears midline. Mucosa normal. No drainage or sinus tenderness. Lungs: clear to auscultation bilaterally Heart: regular rate and rhythm, S1, S2 normal, no murmur, click, rub or gallop Abdomen: soft, minimal tenderness; bowel sounds normal; no masses,  no organomegaly. Extremities: extremities normal, atraumatic, no cyanosis or edema Pulses: 2+ and symmetric Neurologic- Mild weakness on the right side, but finger to nose test and rapidly alternating movement normal on the Rt.  Lab Results: Basic Metabolic Panel:  Recent Labs Lab 07/19/13 0650 07/20/13 0355  NA 136 138  K 3.7 3.8  CL 100 103  CO2 24 24  GLUCOSE 105* 105*  BUN 12 10  CREATININE 0.79 0.88  CALCIUM 8.5 8.3*   Liver Function Tests:  Recent Labs Lab 07/18/13 0634 07/19/13 0650  AST 19 14  ALT 31 23  ALKPHOS 81 75  BILITOT  0.6 0.6  PROT 6.6 6.1  ALBUMIN 3.7 3.2*    Recent Labs Lab 07/18/13 0634  LIPASE 26   CBC:  Recent Labs Lab 07/18/13 0634 07/19/13 0650 07/20/13 0355  WBC 7.6 7.8 7.8  NEUTROABS 4.9  --   --   HGB 16.0  16.3 14.9 14.3  HCT 46.8  48.0 43.6 42.2  MCV 88.3 87.9 88.1  PLT 167 171 166   Cardiac Enzymes:  Recent Labs Lab 07/18/13 1653 07/18/13 2310 07/19/13 0650  TROPONINI <0.30 <0.30 <0.30   D-Dimer:  Recent Labs Lab 07/18/13 1935  DDIMER 0.83*   CBG:  Recent Labs Lab 07/18/13 0845 07/18/13 1708  GLUCAP 87 99   Hemoglobin A1C:  Recent Labs Lab 07/18/13 0634  HGBA1C 5.7*   Fasting Lipid Panel:  Recent Labs Lab 07/19/13 0650  CHOL 197  HDL 29*  LDLCALC 122*  TRIG 228*  CHOLHDL 6.8   Coagulation:  Recent Labs Lab 07/18/13 0634 07/19/13 0650  LABPROT 12.9 13.3  INR 0.99 1.03   Urine Drug Screen: Drugs of Abuse     Component Value Date/Time   LABOPIA NONE DETECTED 07/18/2013 0924   COCAINSCRNUR NONE DETECTED 07/18/2013 0924   LABBENZ NONE DETECTED 07/18/2013 0924   AMPHETMU NONE DETECTED 07/18/2013 0924   THCU NONE DETECTED 07/18/2013 0924   LABBARB NONE DETECTED 07/18/2013 7829  Alcohol Level:  Recent Labs Lab 07/18/13 0634  ETH <11   Urinalysis:  Recent Labs Lab 07/18/13 0924  COLORURINE YELLOW  LABSPEC 1.031*  PHURINE 6.5  GLUCOSEU NEGATIVE  HGBUR NEGATIVE  BILIRUBINUR NEGATIVE  KETONESUR NEGATIVE  PROTEINUR NEGATIVE  UROBILINOGEN 1.0  NITRITE NEGATIVE  LEUKOCYTESUR NEGATIVE   Medications: I have reviewed the patient's current medications. Scheduled Meds: . aspirin EC  81 mg Oral Daily  . ketorolac  10 mg Oral Q8H  . pantoprazole  40 mg Oral Daily   Continuous Infusions:  PRN Meds:.morphine injection Assessment/Plan: Active Problems:   Syncope   Seizure   Hypertension   Morbid obesity   Cigarette smoker two packs a day or less  # Syncope: Etiology still not clear. Differentials include seizure  disorder Vs cardiac arrhythmias. EEG and video monitoring revealed that episode described as a seizure was actually not. Supporting a diagnosis of  Psychogenic seizures. Likely why dilantin was discont at [facility] 3 years ago. Strong consideration for OSA, which can cause increase vagal tone during sleep, and would explain pts bradycardia- 40s as seen on tele -as these have all been happening during sleep. Last night patient had bradycardia- 60s and tachycardia- 140s, during sleep. Also has never had a sleep study to formally diagnose OSA. But arrythtmias can not be ruled out, considering initial abnormal EKGs. PE workup with CTA- Negative. Cardiac enzs- Neg. - Out-pt sleep study. - PT/Ot eval.  -  CPAP machine, but with pts traumatized nose, this would be a challenge. - Cardiovascular etiology can't be excluded in the view of pauses and bradycardia noted on telemetry. Home beta blockers, have been held.   - Avoid any conduction blockade medications upon discharge.  - Neurology following - Recs appreciated  - Cardiology consult. - Consider Event monitoring as an out-pt. - Discharge today pending cardiology input.  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 Dilantin was discontinued 3 years ago by [facility], no records could be gotten from care everywhere. Seizures were thought to be psychogenic in nature. He has been seizure-free since that time. As noted above, EEG has been unremarkable. Initial neurologic fingings likely post-ictal.  - Discont keppra BID. - Talked to neurology about pts driving and line of work- works in a [employer], Patient should not drive as long as he is having seizures, until he is cleared by a physician, but can continue with his line of work.  # HTN- Home meds- Bisoprolol- HCTZ- 5-6.25mg .  - Hold blood pressure meds for now. Will hold BB and CCB  with bradycardia episodes and pauses on telemetry  - Other anti-HTns should be considered on discharge- Diuretics and ACE inhibitors.  # Facial Trauma- Seen on CT as Mildly displaced nasal bone fracture  - Talked to ENT surgeon who said since the frcature is not obvious Visually on looking at the patient, patient should follow up as an out-patient in a week.  - Patient declined oral opioid pain meds, currently on IV morphine- 2mg  Q4H, toradol 10mg  q8h.  # Abdominal pain: Resolving,  He has no risk factors for peritonitis. Abd Ct so far negative for explanatory cause. CMP revealed- normal LFTS, lipase 26.   - cont with PPI- Protonix- 40mg  daily. - FOBT Negative.   # Cigarette- 2 pack /day for the past 26 years.  - Nicotine patch.   Dispo: Disposition is deferred  at this time, awaiting improvement of current medical problems.   The patient does have a current PCP (Pcp Not In System) and does need an [facility] hospital follow-up appointment after discharge.  The patient does not know have transportation limitations that hinder transportation to clinic appointments.  .Services Needed at time of discharge: Y = Yes, Blank = No PT:   OT:   RN:   Equipment:   Other:     LOS: 3 days   Kennis Carina, MD 07/21/2013, 12:20 PM
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
## 2013-07-21 NOTE — Progress Notes (Deleted)
LTVM EEG restarted, day 2.

## 2013-07-21 NOTE — Progress Notes (Signed)
Internal Medicine Attending  Date: 07/21/2013  Patient name: Caleb Rodriguez Medical record number: [id number] Date of birth: 1970/12/28 Age: 42 y.o. Gender: male  I saw and evaluated the patient and discussed his care with house staff. I reviewed the resident's note by Dr. Mariea Clonts and I agree with the resident's findings and plans as documented in her note, with the following additional comments.  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  Patient is agreeable to getting psychiatric input, which can best be arranged as an outpatient.  He had an episode of tachycardia in the 140s this morning; given this, and the syncopal event which prompted his admission, we have consulted cardiology regarding possible need for an event monitor or further cardiac workup.  Dr. Rogelia Boga will take over as attending physician tomorrow 07/22/2013.

## 2013-07-21 NOTE — Procedures (Signed)
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  Sedation: None  Background: There is a well defined posterior dominant rhythm of 10 Hz that attenuates with eye opening. There is increased felt associated with drowsiness. Sleep was not recorded  Photic stimulation: Physiologic driving is at not performed  EEG Abnormalities: None  Clinical Interpretation: This normal EEG is recorded in the waking and drowsy state. There was no seizure or seizure predisposition recorded on this study.   Ritta Slot, MD [facility] [phone]  If 7pm- 7am, please page neurology on call at [phone].
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
## 2013-07-21 NOTE — Progress Notes (Signed)
Patient wanted LTM removed; tech removed. Dr. Amada Jupiter informed.

## 2013-07-22 DIAGNOSIS — R55 Syncope and collapse: Secondary | ICD-10-CM

## 2013-07-22 MED ORDER — HYDROCHLOROTHIAZIDE 12.5 MG PO CAPS: 12.5000 mg | ORAL_CAPSULE | Freq: Every day | ORAL | Status: AC

## 2013-07-22 MED ORDER — MELOXICAM 15 MG PO TABS: 15.0000 mg | ORAL_TABLET | Freq: Every day | ORAL | Status: DC | PRN
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
## 2013-07-22 NOTE — Progress Notes (Signed)
Discharge instructions given. Patient verbalized understanding and all questions were answered.  ?

## 2013-07-22 NOTE — Progress Notes (Signed)
Subjective: Caleb Rodriguez was seen and examined today.  He denies any new episodes of syncope, CP or SOB.  His appetite is good and he has normal bowel and bladder habits.  No major events on telemetry overnight.  In NSR.   Objective: Vital signs in last 24 hours: Filed Vitals:   07/21/13 2128 07/22/13 0124 07/22/13 0540 07/22/13 1431  BP: 165/92 139/89 157/87 150/84  Pulse: 65 69 63 63  Temp: 98.1 F (36.7 C) 98.1 F (36.7 C) 97.8 F (36.6 C) 98.1 F (36.7 C)  TempSrc: Oral Oral Oral Oral  Resp: 20 22 20 21   Weight:   119.4 kg (263 lb 3.7 oz)   SpO2: 100% 96% 97% 96%   Weight change: -3 kg (-6 lb 9.8 oz) No intake or output data in the 24 hours ending 07/22/13 1630 General: resting in bed in NAD HEENT: PERRL, EOMI, no scleral icterus Cardiac: RRR, no rubs, murmurs or gallops Pulm: clear to auscultation bilaterally, moving normal volumes of air Abd: soft, nontender, nondistended, BS present Ext: warm and well perfused, no pedal edema Neuro: alert and oriented X3, decreased MMS RUE and RLE  Lab Results: Basic Metabolic Panel:  Recent Labs Lab 07/19/13 0650 07/20/13 0355  NA 136 138  K 3.7 3.8  CL 100 103  CO2 24 24  GLUCOSE 105* 105*  BUN 12 10  CREATININE 0.79 0.88  CALCIUM 8.5 8.3*   Liver Function Tests:  Recent Labs Lab 07/18/13 0634 07/19/13 0650  AST 19 14  ALT 31 23  ALKPHOS 81 75  BILITOT 0.6 0.6  PROT 6.6 6.1  ALBUMIN 3.7 3.2*    Recent Labs Lab 07/18/13 0634  LIPASE 26   CBC:  Recent Labs Lab 07/18/13 0634 07/19/13 0650 07/20/13 0355  WBC 7.6 7.8 7.8  NEUTROABS 4.9  --   --   HGB 16.0  16.3 14.9 14.3  HCT 46.8  48.0 43.6 42.2  MCV 88.3 87.9 88.1  PLT 167 171 166   Cardiac Enzymes:  Recent Labs Lab 07/18/13 1653 07/18/13 2310 07/19/13 0650  TROPONINI <0.30 <0.30 <0.30   D-Dimer:  Recent Labs Lab 07/18/13 1935  DDIMER 0.83*   CBG:  Recent Labs Lab 07/18/13 0845 07/18/13 1708  GLUCAP 87 99    Hemoglobin A1C:  Recent Labs Lab 07/18/13 0634  HGBA1C 5.7*   Fasting Lipid Panel:  Recent Labs Lab 07/19/13 0650  CHOL 197  HDL 29*  LDLCALC 122*  TRIG 228*  CHOLHDL 6.8   Coagulation:  Recent Labs Lab 07/18/13 0634 07/19/13 0650  LABPROT 12.9 13.3  INR 0.99 1.03   Urine Drug Screen: Drugs of Abuse     Component Value Date/Time   LABOPIA NONE DETECTED 07/18/2013 0924   COCAINSCRNUR NONE DETECTED 07/18/2013 0924   LABBENZ NONE DETECTED 07/18/2013 0924   AMPHETMU NONE DETECTED 07/18/2013 0924   THCU NONE DETECTED 07/18/2013 0924   LABBARB NONE DETECTED 07/18/2013 0924    Alcohol Level:  Recent Labs Lab 07/18/13 0634  ETH <11   Urinalysis:  Recent Labs Lab 07/18/13 0924  COLORURINE YELLOW  LABSPEC 1.031*  PHURINE 6.5  GLUCOSEU NEGATIVE  HGBUR NEGATIVE  BILIRUBINUR NEGATIVE  KETONESUR NEGATIVE  PROTEINUR NEGATIVE  UROBILINOGEN 1.0  NITRITE NEGATIVE  LEUKOCYTESUR NEGATIVE   Medications: I have reviewed the patient's current medications. Scheduled Meds: . aspirin EC  81 mg Oral Daily  . ketorolac  10 mg Oral Q8H  . 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  # Syncope: Unclear etiology and cardiopulmonary and neurologic causes ruled out.  Cardiology suggests vasovagal etiology in the setting or URI.  Negative cardiac enzymes.  Cardiology recommends outpatient stress test since patient has had intermittent CP in the past.  Patient with episodes of bradyarrhythmia and pauses during sleep.  Cardiology recommends outpatient sleep study given episodes of bradyarrhythmia during sleep and history of snoring.   # Questionable seizures:  Patient with episodes of unresponsiveness during hospitalization.  No seizures on EEG.  Neuro evaluated the patient and suggest episodes in hospital are psychogenic and there  is no need for anti-seizure medications.  Neuro feels that the event that brought the patient into the hospital was not similar to events in hospital and is suggestive of syncope. - outpatient follow-up with psychiatry - discharge with six month driving restriction and instruction to follow-up with psychiatry   # HTN: Home meds- Bisoprolol- HCTZ- 5-6.25mg ; medications held during admission.  SBPs  110s-160s without treatment during admission. - discharge on HCTZ 12.5; hold beta blocker given episodes of brady - re-assess BP at hospital follow-up  # Facial Trauma- s/p fall and mildly displaced nasal bone fracture on CT. - ENT follow-up in 1 week  Dispo: Disposition is deferred at this time, awaiting improvement of current medical problems.   The patient does have a current PCP (Pcp Not In System) and does need an [facility] hospital follow-up appointment after discharge.  The patient does not know have transportation limitations that hinder transportation to clinic appointments.  .Services Needed at time of discharge: Y = Yes, Blank = No PT:   OT:   RN:   Equipment:   Other:     LOS: 4 days   Evelena Peat, DO 07/22/2013, 4:30 PM

## 2013-07-22 NOTE — Evaluation (Signed)
Occupational Therapy Evaluation Patient Details Name: Caleb Rodriguez MRN: [id number] DOB: 04/16/1971 Today's Date: 07/22/2013 Time:  0820- 0852  32 minutes  OT Assessment / Plan / Recommendation History of present illness Mr Bowdish is a 30 y o male with PMH of Seizures, HTN, restless leg syndrome presented to the ED with c/o loss of consciousness taht happened whne he was standing outside his work place with his boss at about 5.30 am this morning. Pt states thsi is the last thing he remembered, and before he passes out,m he had a rnging in his ears. Then he woker up in the hospital. His boss went to get help from th epolice station across the street, and ambulance wa sclalled. It is not known if pt had jerking of his extremities, urinary of fecal incontinence. No reported tongue biting, not known if there is urinary or fecal incontinence, endorses having some heavines on the Right upper an dlower extremity, with tingling sensation, no prior chest pain, but has occasional palpitations, endorses having difficulty breathing-sometimes at rest or with activity, endorses leg swelling over the past 2 weeks, uses 2 pillows to sleep, this has not changed recently,  No cough or fever. No hx of CAD, in himself or any first degree relatives, no recent travel, no hx of leg clots. Also smokes 2 packs of cigarette a day, and has smoked for the past 26 years.    Clinical Impression   Pt admitted with above. Will continue to follow acutely in order to address below problem list.  Pt continues to present with Left side weakness although inconsistencies noted during manual muscle test vs clinical observation during functional tasks.  Recommending OPOT to further progress rehab of LUE and to improve strength and function.      OT Assessment  Patient needs continued OT Services    Follow Up Recommendations  Outpatient OT;Supervision/Assistance - 24 hour    Barriers to Discharge      Equipment  Recommendations  3 in 1 bedside comode (tub seat TBD)    Recommendations for Other Services    Frequency  Min 2X/week    Precautions / Restrictions Precautions Precautions: Fall   Pertinent Vitals/Pain See vitals    ADL  Eating/Feeding: Performed;Modified independent Where Assessed - Eating/Feeding: Chair Grooming: Performed;Wash/dry hands;Min guard Where Assessed - Grooming: Unsupported standing Toilet Transfer: Performed;Min guard Statistician Method: Sit to Barista: Comfort height toilet Toileting - Clothing Manipulation and Hygiene: Performed;Min guard Where Assessed - Engineer, mining and Hygiene: Sit to stand from 3-in-1 or toilet Equipment Used: Rolling walker;Gait belt Transfers/Ambulation Related to ADLs: Min guard with RW ADL Comments: Inconsistencies noted during eval regarding left side weakness.  Pt unable to complete full ROM for LUE but able to bear down through RW for support during ambulation to offset weight on LLE.  Pt quickly fatiguing by end of session. When coming out of bathroom pt became slow to process and required increased time to respond to therapist questions/commands.  Quickly brought up chair behind pt and assisted him with stand>sit.  Pt stating he just feels very weak.    OT Diagnosis: Generalized weakness;Acute pain;Paresis  OT Problem List: Decreased strength;Decreased activity tolerance;Impaired balance (sitting and/or standing);Decreased knowledge of use of DME or AE;Impaired UE functional use;Impaired sensation;Pain OT Treatment Interventions: Self-care/ADL training;DME and/or AE instruction;Therapeutic activities;Patient/family education;Balance training   OT Goals(Current goals can be found in the care plan section) Acute Rehab OT Goals Patient Stated Goal: to return home OT Goal  Formulation: With patient Time For Goal Achievement: 07/29/13 Potential to Achieve Goals: Good  Visit Information  Last  OT Received On: 07/22/13 Assistance Needed: +2 (chair follow for ambulating distances) PT/OT/SLP Co-Evaluation/Treatment: Yes Reason for Co-Treatment: For patient/therapist safety OT goals addressed during session: ADL's and self-care History of Present Illness: Mr Timmons is a 42 y o male with PMH of Seizures, HTN, restless leg syndrome presented to the ED with c/o loss of consciousness taht happened whne he was standing outside his work place with his boss at about 5.30 am this morning. Pt states thsi is the last thing he remembered, and before he passes out,m he had a rnging in his ears. Then he woker up in the hospital. His boss went to get help from th epolice station across the street, and ambulance wa sclalled. It is not known if pt had jerking of his extremities, urinary of fecal incontinence. No reported tongue biting, not known if there is urinary or fecal incontinence, endorses having some heavines on the Right upper an dlower extremity, with tingling sensation, no prior chest pain, but has occasional palpitations, endorses having difficulty breathing-sometimes at rest or with activity, endorses leg swelling over the past 2 weeks, uses 2 pillows to sleep, this has not changed recently,  No cough or fever. No hx of CAD, in himself or any first degree relatives, no recent travel, no hx of leg clots. Also smokes 2 packs of cigarette a day, and has smoked for the past 26 years.        Prior Functioning     Home Living Family/patient expects to be discharged to:: Private residence Living Arrangements: Spouse/significant other;Children Available Help at Discharge: Family Type of Home: House Home Access: Stairs to enter Secretary/administrator of Steps: 3 Entrance Stairs-Rails: None Home Layout: One level Prior Function Level of Independence: Independent Communication Communication: No difficulties Dominant Hand: Left         Vision/Perception     Cognition   Cognition Arousal/Alertness: Awake/alert Behavior During Therapy: WFL for tasks assessed/performed Overall Cognitive Status: Within Functional Limits for tasks assessed    Extremity/Trunk Assessment Upper Extremity Assessment Upper Extremity Assessment: LUE deficits/detail LUE Deficits / Details: 2+/5 throughout LUE Sensation: decreased light touch;decreased proprioception LUE Coordination: decreased fine motor     Mobility Bed Mobility Bed Mobility: Supine to Sit;Sitting - Scoot to Edge of Bed Supine to Sit: 5: Supervision Sitting - Scoot to Edge of Bed: 5: Supervision Transfers Transfers: Sit to Stand;Stand to Sit Sit to Stand: 4: Min guard;From bed;From toilet;With upper extremity assist Stand to Sit: 4: Min guard;To toilet;4: Min assist;To chair/3-in-1 Details for Transfer Assistance: Min assist for stand>sit to chair as pt was fatiguing and requiring increased time to respond to therapist.     Exercise     Balance     End of Session OT - End of Session Equipment Utilized During Treatment: Gait belt;Rolling walker Activity Tolerance: Patient limited by fatigue Patient left: in chair;with call bell/phone within reach;with family/visitor present Nurse Communication: Mobility status  GO   07/22/2013 Cipriano Mile OTR/L Pager [phone] Office [phone]   Cipriano Mile 07/22/2013, 2:59 PM

## 2013-07-22 NOTE — Evaluation (Signed)
Physical Therapy Evaluation Patient Details Name: Caleb Rodriguez MRN: [id number] DOB: 1970/11/22 Today's Date: 07/22/2013 Time: 4098-1191 PT Time Calculation (min): 37 min  PT Assessment / Plan / Recommendation History of Present Illness  Caleb Rodriguez is a 42 y o male with PMH of Seizures, HTN, restless leg syndrome presented to the ED with c/o loss of consciousness taht happened whne he was standing outside his work place with his boss at about 5.30 am this morning. Pt states thsi is the last thing he remembered, and before he passes out,m he had a rnging in his ears. Then he woker up in the hospital. His boss went to get help from th epolice station across the street, and ambulance wa sclalled. It is not known if pt had jerking of his extremities, urinary of fecal incontinence. No reported tongue biting, not known if there is urinary or fecal incontinence, endorses having some heavines on the Right upper an dlower extremity, with tingling sensation, no prior chest pain, but has occasional palpitations, endorses having difficulty breathing-sometimes at rest or with activity, endorses leg swelling over the past 2 weeks, uses 2 pillows to sleep, this has not changed recently,  No cough or fever. No hx of CAD, in himself or any first degree relatives, no recent travel, no hx of leg clots. Also smokes 2 packs of cigarette a day, and has smoked for the past 26 years.   Clinical Impression   Pt admitted with above. Pt currently with functional limitations due to the deficits listed below (see PT Problem List).  Pt will benefit from skilled PT to increase their independence and safety with mobility to allow discharge to the venue listed below.    In standing, noted pt's R knee buckling in focused single limb stance; this did not occur in single limb stance while walking     PT Assessment  Patient needs continued PT services    Follow Up Recommendations  Outpatient PT;Supervision -  Intermittent Will need a prescription for:  "Outpatient PT for gait and balance dysfunction: Evaluate and treat"    Does the patient have the potential to tolerate intense rehabilitation      Barriers to Discharge   3 STE, no rails    Equipment Recommendations  Rolling walker with 5" wheels    Recommendations for Other Services     Frequency Min 3X/week    Precautions / Restrictions Precautions Precautions: Fall   Pertinent Vitals/Pain no apparent distress       Mobility  Bed Mobility Bed Mobility: Supine to Sit;Sitting - Scoot to Edge of Bed Supine to Sit: 5: Supervision Sitting - Scoot to Edge of Bed: 5: Supervision Details for Bed Mobility Assistance: smooth transition Transfers Transfers: Sit to Stand;Stand to Sit Sit to Stand: 4: Min guard;From bed;From toilet;With upper extremity assist Stand to Sit: 4: Min guard;To toilet;4: Min assist;To chair/3-in-1 Details for Transfer Assistance: Min assist for stand>sit to chair as pt was fatiguing and requiring increased time to respond to therapist. Ambulation/Gait Ambulation/Gait Assistance: 1: +2 Total assist (second person to push chair behind) Ambulation/Gait: Patient Percentage: 70% Ambulation Distance (Feet): 50 Feet Assistive device: Rolling walker Ambulation/Gait Assistance Details: Cues for gait sequence, and to self-monitor for R stance stability; Min to no R knee buckling with amb; slowed speed with incr distnce and fatigue Gait Pattern: Decreased step length - right;Decreased step length - left General Gait Details: Decr foot clearance with R swing, noted occasional scuffing/catching on floor    Exercises  PT Diagnosis: Difficulty walking  PT Problem List: Decreased strength;Decreased activity tolerance;Decreased balance;Decreased mobility;Decreased coordination;Decreased knowledge of use of DME PT Treatment Interventions: DME instruction;Gait training;Functional mobility training;Therapeutic  activities;Therapeutic exercise;Balance training;Neuromuscular re-education;Patient/family education;Stair training     PT Goals(Current goals can be found in the care plan section) Acute Rehab PT Goals Patient Stated Goal: to return home PT Goal Formulation: With patient Time For Goal Achievement: 08/05/13 Potential to Achieve Goals: Good  Visit Information  Last PT Received On: 07/22/13 Assistance Needed: +2 (chair follow for ambulating distances) PT/OT/SLP Co-Evaluation/Treatment: Yes Reason for Co-Treatment: For patient/therapist safety PT goals addressed during session: Mobility/safety with mobility OT goals addressed during session: ADL's and self-care History of Present Illness: Caleb Rodriguez is a 42 y o male with PMH of Seizures, HTN, restless leg syndrome presented to the ED with c/o loss of consciousness taht happened whne he was standing outside his work place with his boss at about 5.30 am this morning. Pt states thsi is the last thing he remembered, and before he passes out,m he had a rnging in his ears. Then he woker up in the hospital. His boss went to get help from th epolice station across the street, and ambulance wa sclalled. It is not known if pt had jerking of his extremities, urinary of fecal incontinence. No reported tongue biting, not known if there is urinary or fecal incontinence, endorses having some heavines on the Right upper an dlower extremity, with tingling sensation, no prior chest pain, but has occasional palpitations, endorses having difficulty breathing-sometimes at rest or with activity, endorses leg swelling over the past 2 weeks, uses 2 pillows to sleep, this has not changed recently,  No cough or fever. No hx of CAD, in himself or any first degree relatives, no recent travel, no hx of leg clots. Also smokes 2 packs of cigarette a day, and has smoked for the past 26 years.        Prior Functioning  Home Living Family/patient expects to be discharged to::  Private residence Living Arrangements: Spouse/significant other;Children Available Help at Discharge: Family Type of Home: House Home Access: Stairs to enter Secretary/administrator of Steps: 3 Entrance Stairs-Rails: None Home Layout: One level Prior Function Level of Independence: Independent Communication Communication: No difficulties Dominant Hand: Left    Cognition  Cognition Arousal/Alertness: Awake/alert Behavior During Therapy: WFL for tasks assessed/performed Overall Cognitive Status: Within Functional Limits for tasks assessed    Extremity/Trunk Assessment Upper Extremity Assessment Upper Extremity Assessment: Defer to OT evaluation LUE Deficits / Details: 2+/5 throughout LUE Sensation: decreased light touch;decreased proprioception LUE Coordination: decreased fine motor Lower Extremity Assessment Lower Extremity Assessment: RLE deficits/detail RLE Deficits / Details: Grossly 3/5 throughout, difficult to discern true pt effort RLE Coordination: decreased gross motor (decr ability to perform heel to shin)   Balance    End of Session PT - End of Session Equipment Utilized During Treatment: Gait belt Activity Tolerance: Patient tolerated treatment well;Patient limited by fatigue (Fatigue at end of session) Patient left: in chair;with call bell/phone within reach;with family/visitor present Nurse Communication: Mobility status  GP     Olen Pel Allyn, [place] [phone]  07/22/2013, 3:29 PM

## 2013-07-22 NOTE — Progress Notes (Signed)
Subjective: No further spells.   Exam: Filed Vitals:   07/22/13 0540  BP: 157/87  Pulse: 63  Temp: 97.8 F (36.6 C)  Resp: 20   Gen: In bed, NAD  MS: Awake, Alert, oriented and appropriate  ZO:XWRUE, EOMI  Motor: 5/5 throughout left side . 4/5 with poor effort on right.  Sensory: Reports "tingly" sensation in right arm to touch.   Impression: 42 yo M with syncope on admission and previous diagnosis of psychogenic non-epileptic spells who has had recurrent episodes of right arm fumbling and unresponsiveness. His spell was captured on EEG with no EEG change. 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 His right sided weakness follows these episodes and has been present to some degree since admission. MRI is negative. He has some inconsistencies on exam suggestive of psychogenic weakness.   The event that brought him in was not liek the ones captured, but rather more suggestive of syncope.   Recommendations: 1) agree with outpatient counseling.  2) Neurology will sign off at this time, please call with any further questions.   Ritta Slot, MD [facility] [phone]  If 7pm- 7am, please page neurology on call at [phone].

## 2013-07-23 NOTE — Care Management Note (Signed)
    Page 1 of 1   07/23/2013     10:33:34 AM   CARE MANAGEMENT NOTE 07/23/2013  Patient:  BERTHOLD, GLACE   Account Number:  [id number]  Date Initiated:  07/23/2013  Documentation initiated by:  Elmer Bales  Subjective/Objective Assessment:   Patient admitted for syncope, seizure.  Lives at home with wife.     Action/Plan:   Will follow for discharge needs.   Anticipated DC Date:     Anticipated DC Plan:        DC Planning Services  CM consult      Choice offered to / List presented to:  C-3 Spouse           Status of service:  Completed, signed off Medicare Important Message given?   (If response is "NO", the following Medicare IM given date fields will be blank) Date Medicare IM given:   Date Additional Medicare IM given:    Discharge Disposition:  HOME/SELF CARE  Per UR Regulation:    If discussed at Long Length of Stay Meetings, dates discussed:    Comments:  07/23/13 1000 Elmer Bales RN, MSN, CM- CM recieved a call from Dr Andrey Campanile regarding new orders for Outpatient PT/OT and DME for patient, who was discharged yesterday 07/22/13.  CM spoke with patient, who requested that CM contact his wife Marchelle Folks at work [phone].  Patient's wife has chosen [facility] [phone].  CM contacted the rehab center and spoke with [facility].  Information was faxed to [phone].  Patient's wife declined the ordered DME.

## 2013-07-23 NOTE — Progress Notes (Signed)
UR complete.  Darlean Warmoth RN, MSN 

## 2013-07-24 ENCOUNTER — Encounter (HOSPITAL_COMMUNITY): Payer: Self-pay | Admitting: Emergency Medicine

## 2013-07-24 ENCOUNTER — Emergency Department (HOSPITAL_COMMUNITY)
Admission: EM | Admit: 2013-07-24 | Discharge: 2013-07-25 | Disposition: A | Discharge status: Home / Self Care | Payer: BC Managed Care – PPO | Attending: Emergency Medicine | Admitting: Emergency Medicine

## 2013-07-24 ENCOUNTER — Emergency Department (HOSPITAL_COMMUNITY): Payer: BC Managed Care – PPO

## 2013-07-24 DIAGNOSIS — R4182 Altered mental status, unspecified: Secondary | ICD-10-CM | POA: Insufficient documentation

## 2013-07-24 DIAGNOSIS — R55 Syncope and collapse: Secondary | ICD-10-CM | POA: Insufficient documentation

## 2013-07-24 DIAGNOSIS — R5381 Other malaise: Secondary | ICD-10-CM | POA: Insufficient documentation

## 2013-07-24 DIAGNOSIS — Z8669 Personal history of other diseases of the nervous system and sense organs: Secondary | ICD-10-CM | POA: Insufficient documentation

## 2013-07-24 DIAGNOSIS — Z79899 Other long term (current) drug therapy: Secondary | ICD-10-CM | POA: Insufficient documentation

## 2013-07-24 DIAGNOSIS — R079 Chest pain, unspecified: Secondary | ICD-10-CM | POA: Insufficient documentation

## 2013-07-24 DIAGNOSIS — I10 Essential (primary) hypertension: Secondary | ICD-10-CM | POA: Insufficient documentation

## 2013-07-24 DIAGNOSIS — F17200 Nicotine dependence, unspecified, uncomplicated: Secondary | ICD-10-CM | POA: Insufficient documentation

## 2013-07-24 DIAGNOSIS — R51 Headache: Secondary | ICD-10-CM | POA: Insufficient documentation

## 2013-07-24 LAB — POCT I-STAT, CHEM 8
Chloride: 102 mEq/L (ref 96–112)
Glucose, Bld: 88 mg/dL (ref 70–99)
HCT: 46 % (ref 39.0–52.0)
Potassium: 4.4 mEq/L (ref 3.7–5.3)

## 2013-07-24 LAB — POCT I-STAT TROPONIN I
Troponin i, poc: 0.01 ng/mL (ref 0.00–0.08)
Troponin i, poc: 0.02 ng/mL (ref 0.00–0.08)

## 2013-07-24 LAB — CBC WITH DIFFERENTIAL/PLATELET
Basophils Absolute: 0 10*3/uL (ref 0.0–0.1)
Basophils Relative: 0 % (ref 0–1)
Hemoglobin: 15.3 g/dL (ref 13.0–17.0)
Lymphs Abs: 1.4 10*3/uL (ref 0.7–4.0)
MCHC: 34.9 g/dL (ref 30.0–36.0)
Monocytes Relative: 8 % (ref 3–12)
Neutro Abs: 6.6 10*3/uL (ref 1.7–7.7)
Neutrophils Relative %: 74 % (ref 43–77)
RBC: 5.05 MIL/uL (ref 4.22–5.81)
RDW: 12.8 % (ref 11.5–15.5)

## 2013-07-24 MED ORDER — SODIUM CHLORIDE 0.9 % IV BOLUS (SEPSIS)
1000.0000 mL | Freq: Once | INTRAVENOUS | Status: AC
  Administered 2013-07-24: 1000 mL via INTRAVENOUS

## 2013-07-24 MED ORDER — KETOROLAC TROMETHAMINE 30 MG/ML IJ SOLN
30.0000 mg | Freq: Once | INTRAMUSCULAR | Status: AC
  Administered 2013-07-24: 30 mg via INTRAVENOUS
  Filled 2013-07-24: qty 1

## 2013-07-24 MED ORDER — HYDROMORPHONE HCL PF 1 MG/ML IJ SOLN
1.0000 mg | Freq: Once | INTRAMUSCULAR | Status: AC
  Administered 2013-07-24: 1 mg via INTRAVENOUS
  Filled 2013-07-24: qty 1

## 2013-07-24 NOTE — ED Notes (Signed)
Family at bedside. 

## 2013-07-24 NOTE — ED Notes (Signed)
MD at bedside. 

## 2013-07-24 NOTE — ED Notes (Addendum)
D/c'd on Sunday. Admitted for bradycardia.  According to wife, "pt. D/c'd with meds to [facility] at a basketball game, became diaphoretic, disoriented, slurred speech, and unresponsive. Upon ems arrival, pt. Alert to painful stimuli. Currently, he's conscious - able to say name and in ambulance.  cbg 97.  Vs: 240/116, hr 136, sao2 114; now: bp 132/86, hr. 115, 99% NRB.  piv 20 lt. A/c. C/o chest pain 8/10, non-reproducible, centralized that is unchanged. According to wife, "pt. Went to pcp and bp, alert and oriented."
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
## 2013-07-25 ENCOUNTER — Telehealth: Payer: Self-pay | Admitting: Cardiology

## 2013-07-25 NOTE — Telephone Encounter (Signed)
New message  Pt wife called blacked out heart rate went up and is requesting an appt today please assist.
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
## 2013-07-29 NOTE — ED Provider Notes (Signed)
I saw and evaluated the patient, reviewed the resident's note and I agree with the findings and plan.  Patient presents to the ER for evaluation of syncope. Patient's cardiac workup including serial enzymes were negative. Remainder of workup was also unremarkable. Patient back to his baseline at arrival to the ER he continues to do well here in the ER. Patient did have very atypical, reproducible chest pain which was felt to be musculoskeletal. After extensive workup, patient was felt to be safe for discharge and outpatient followup.  Gilda Creasehristopher J. Pollina, MD 07/29/13 [phone]

## 2013-07-30 ENCOUNTER — Ambulatory Visit: Payer: BC Managed Care – PPO | Admitting: Physical Therapy

## 2013-08-01 ENCOUNTER — Encounter: Payer: BC Managed Care – PPO | Admitting: Physician Assistant

## 2013-12-07 ENCOUNTER — Ambulatory Visit: Payer: BC Managed Care – PPO | Attending: Orthopedic Surgery | Admitting: Physical Therapy

## 2013-12-07 DIAGNOSIS — IMO0001 Reserved for inherently not codable concepts without codable children: Secondary | ICD-10-CM | POA: Diagnosis not present

## 2013-12-07 DIAGNOSIS — R269 Unspecified abnormalities of gait and mobility: Secondary | ICD-10-CM | POA: Diagnosis not present

## 2013-12-07 DIAGNOSIS — I10 Essential (primary) hypertension: Secondary | ICD-10-CM | POA: Insufficient documentation

## 2013-12-07 DIAGNOSIS — M25569 Pain in unspecified knee: Secondary | ICD-10-CM | POA: Insufficient documentation

## 2013-12-07 DIAGNOSIS — M25669 Stiffness of unspecified knee, not elsewhere classified: Secondary | ICD-10-CM | POA: Insufficient documentation

## 2013-12-07 DIAGNOSIS — R5381 Other malaise: Secondary | ICD-10-CM | POA: Insufficient documentation

## 2013-12-11 ENCOUNTER — Ambulatory Visit: Payer: BC Managed Care – PPO | Admitting: *Deleted

## 2013-12-11 DIAGNOSIS — IMO0001 Reserved for inherently not codable concepts without codable children: Secondary | ICD-10-CM | POA: Diagnosis not present

## 2013-12-13 ENCOUNTER — Ambulatory Visit: Payer: BC Managed Care – PPO | Admitting: *Deleted

## 2013-12-13 DIAGNOSIS — IMO0001 Reserved for inherently not codable concepts without codable children: Secondary | ICD-10-CM | POA: Diagnosis not present

## 2013-12-18 ENCOUNTER — Ambulatory Visit: Payer: BC Managed Care – PPO | Admitting: Physical Therapy

## 2013-12-18 DIAGNOSIS — IMO0001 Reserved for inherently not codable concepts without codable children: Secondary | ICD-10-CM | POA: Diagnosis not present

## 2013-12-20 ENCOUNTER — Ambulatory Visit: Payer: BC Managed Care – PPO | Admitting: Physical Therapy

## 2013-12-20 DIAGNOSIS — IMO0001 Reserved for inherently not codable concepts without codable children: Secondary | ICD-10-CM | POA: Diagnosis not present

## 2013-12-25 ENCOUNTER — Ambulatory Visit: Payer: BC Managed Care – PPO | Attending: Orthopedic Surgery | Admitting: Physical Therapy

## 2013-12-25 DIAGNOSIS — R5381 Other malaise: Secondary | ICD-10-CM | POA: Insufficient documentation

## 2013-12-25 DIAGNOSIS — I10 Essential (primary) hypertension: Secondary | ICD-10-CM | POA: Insufficient documentation

## 2013-12-25 DIAGNOSIS — R269 Unspecified abnormalities of gait and mobility: Secondary | ICD-10-CM | POA: Diagnosis not present

## 2013-12-25 DIAGNOSIS — IMO0001 Reserved for inherently not codable concepts without codable children: Secondary | ICD-10-CM | POA: Diagnosis present

## 2013-12-25 DIAGNOSIS — M25569 Pain in unspecified knee: Secondary | ICD-10-CM | POA: Insufficient documentation

## 2013-12-25 DIAGNOSIS — M25669 Stiffness of unspecified knee, not elsewhere classified: Secondary | ICD-10-CM | POA: Insufficient documentation

## 2013-12-27 ENCOUNTER — Encounter: Payer: BC Managed Care – PPO | Admitting: Physical Therapy

## 2014-01-01 ENCOUNTER — Ambulatory Visit: Payer: BC Managed Care – PPO | Admitting: Physical Therapy

## 2014-01-01 DIAGNOSIS — IMO0001 Reserved for inherently not codable concepts without codable children: Secondary | ICD-10-CM | POA: Diagnosis not present

## 2014-01-02 ENCOUNTER — Ambulatory Visit: Payer: BC Managed Care – PPO | Admitting: Physical Therapy

## 2014-01-02 DIAGNOSIS — IMO0001 Reserved for inherently not codable concepts without codable children: Secondary | ICD-10-CM | POA: Diagnosis not present

## 2014-01-08 ENCOUNTER — Ambulatory Visit: Payer: BC Managed Care – PPO | Admitting: *Deleted

## 2014-01-08 DIAGNOSIS — IMO0001 Reserved for inherently not codable concepts without codable children: Secondary | ICD-10-CM | POA: Diagnosis not present

## 2014-01-10 ENCOUNTER — Encounter: Payer: BC Managed Care – PPO | Admitting: Physical Therapy

## 2014-12-17 IMAGING — CT CT ANGIO CHEST
2 of 10 series · 18 of 46 positions shown · IV contrast (omnipaque)
Comparison: 07/18/2013 chest x-ray

CLINICAL DATA: Syncope, D-dimer 0.83, hypertension, obesity, smoker

EXAM:
CT ANGIOGRAPHY CHEST WITH CONTRAST
TECHNIQUE: Multidetector CT imaging of the chest was performed using the
standard protocol during bolus administration of intravenous
contrast. Multiplanar CT image reconstructions including MIPs were
obtained to evaluate the vascular anatomy.
CONTRAST:  75mL OMNIPAQUE IOHEXOL 350 MG/ML SOLN

[Series 6: thins · axial · 0.73mm/px · z∈[-926,-681]mm · 15 of 277 slices shown]
[im 16/277  lung]
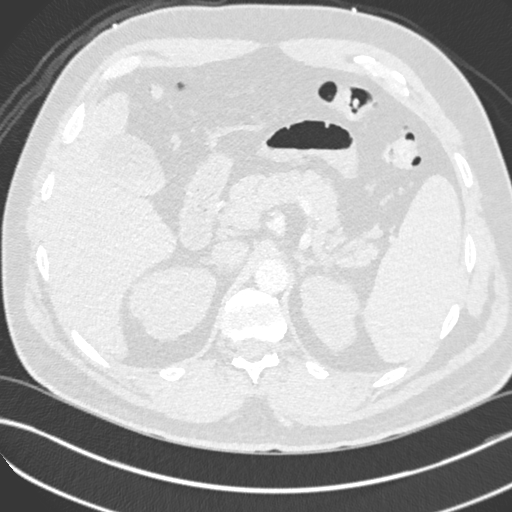
[im 31/277  soft-tissue]
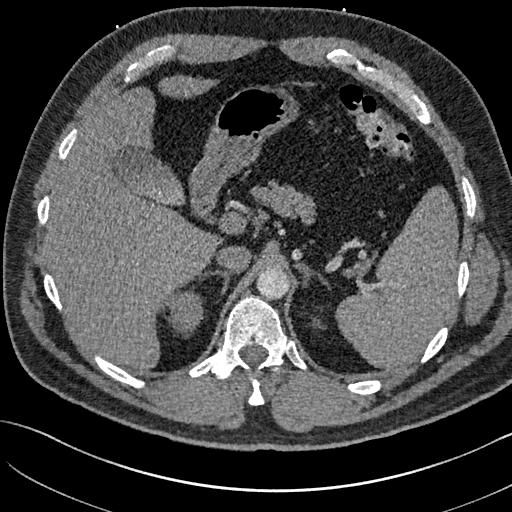
[im 47/277  lung]
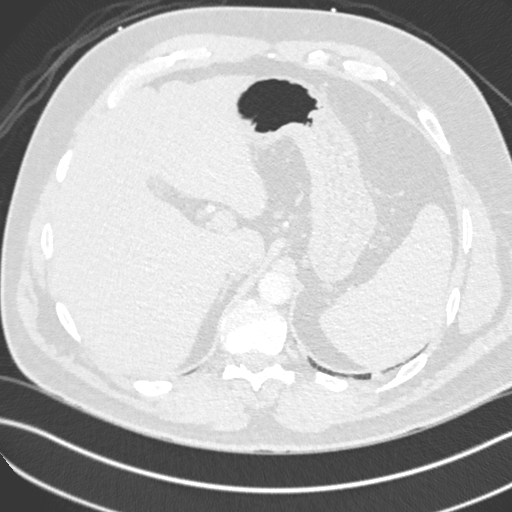
[im 62/277  soft-tissue]
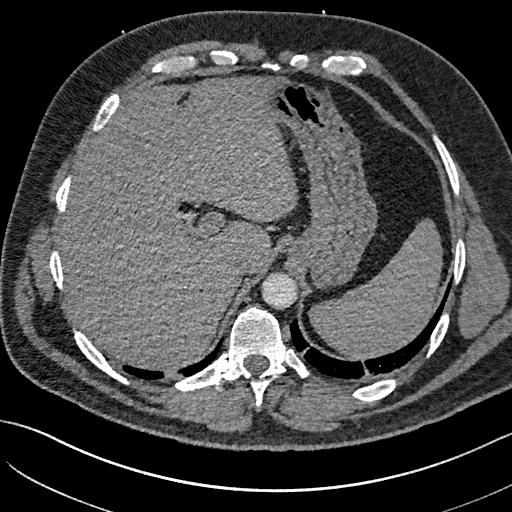
[im 93/277  lung]
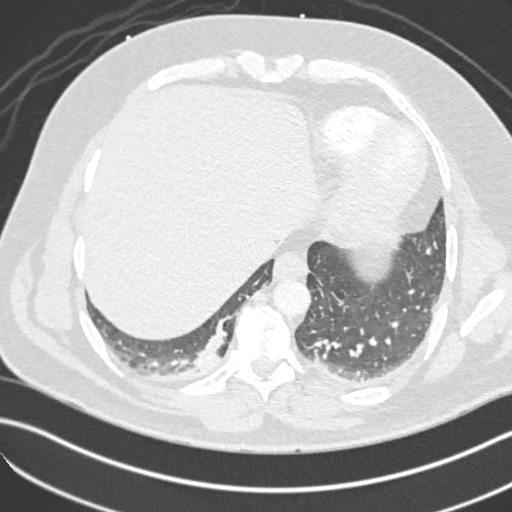
[im 108/277  soft-tissue]
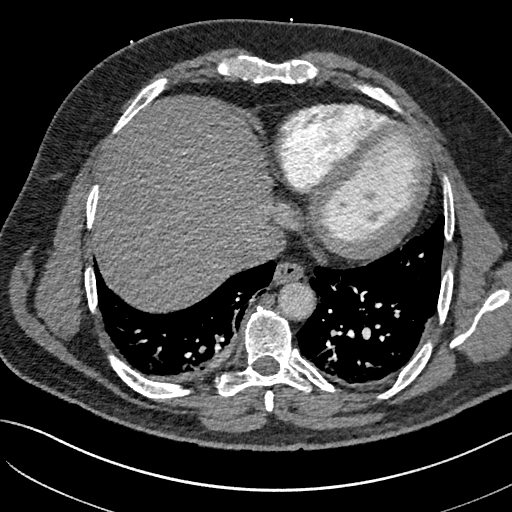
[im 123/277  lung]
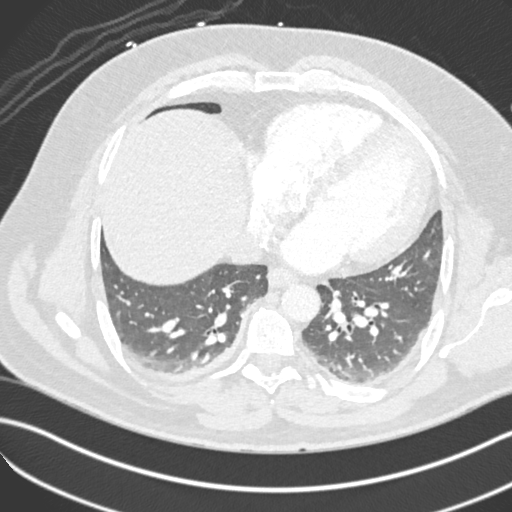
[im 139/277  soft-tissue]
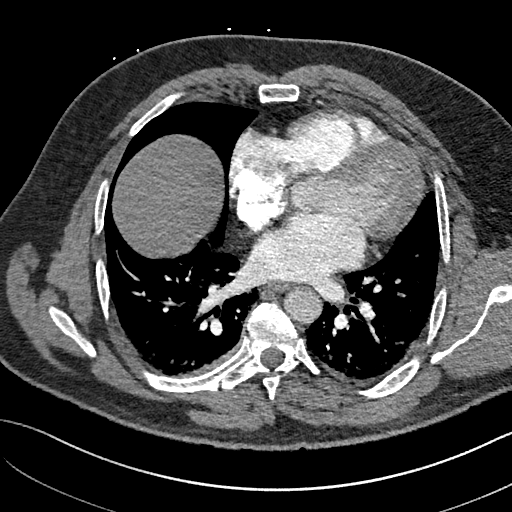
[im 154/277  lung]
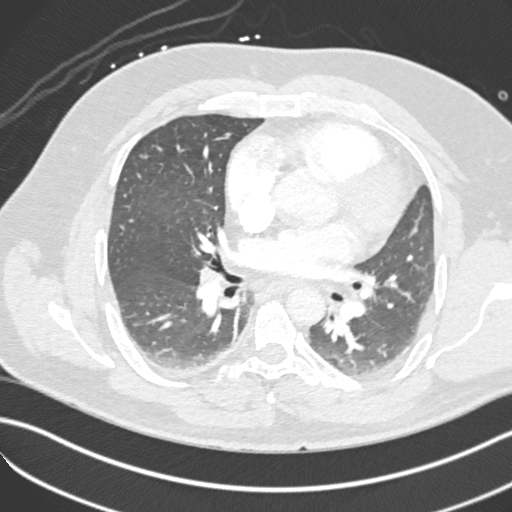
[im 169/277  soft-tissue]
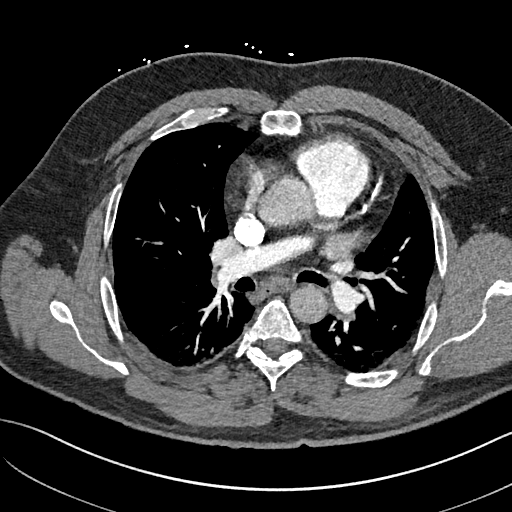
[im 185/277  lung]
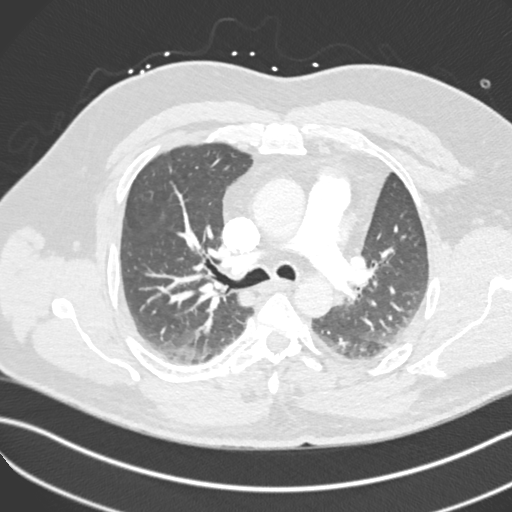
[im 215/277  soft-tissue]
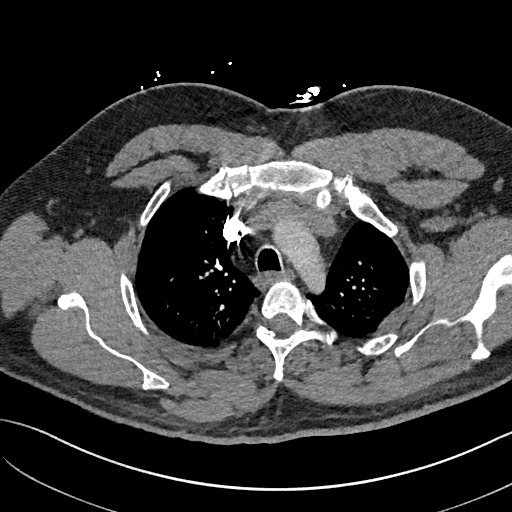
[im 231/277  lung]
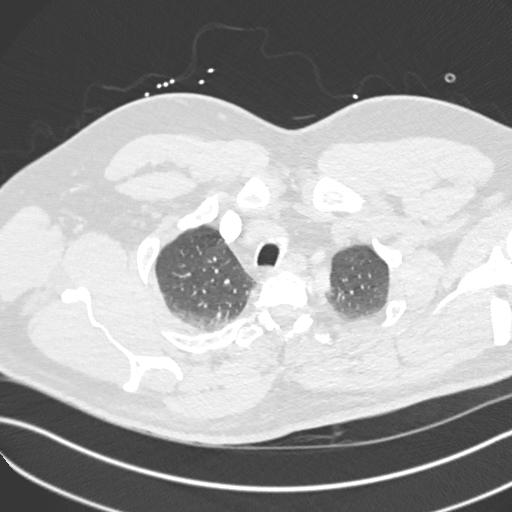
[im 246/277  soft-tissue]
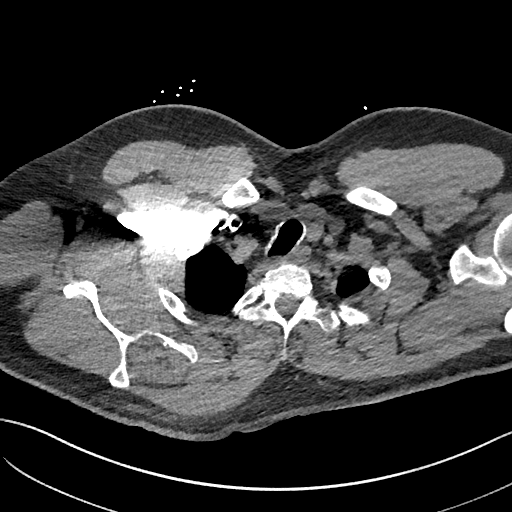
[im 261/277  lung]
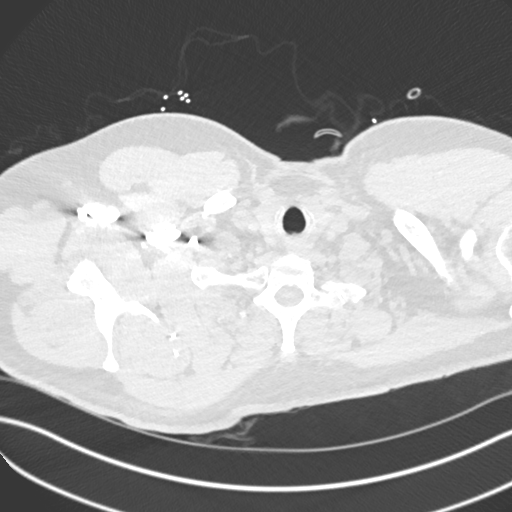

[Series 8: coronal mpr · coronal · 0.55mm/px · 3 of 140 slices shown]
[im 35/140  soft-tissue]
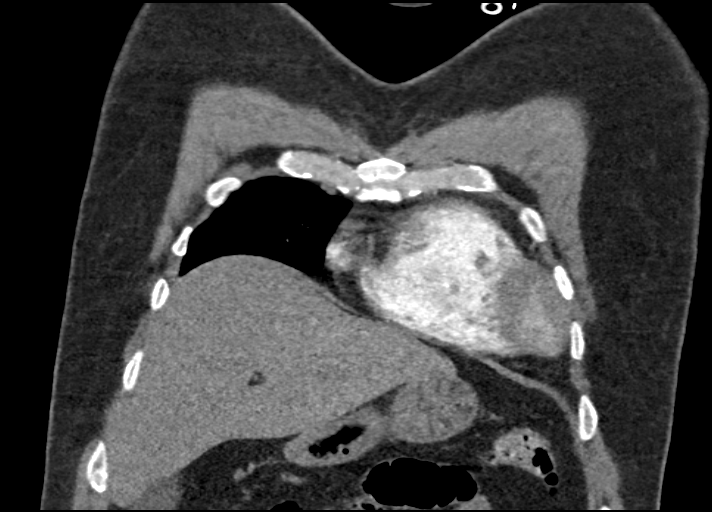
[im 70/140  soft-tissue]
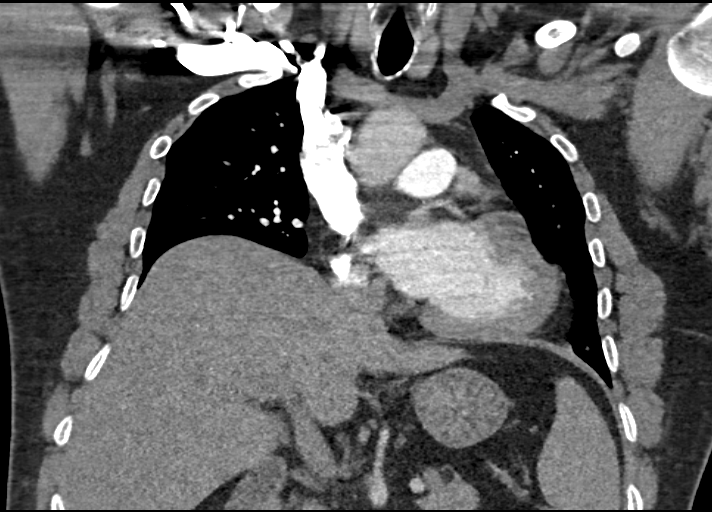
[im 105/140  soft-tissue]
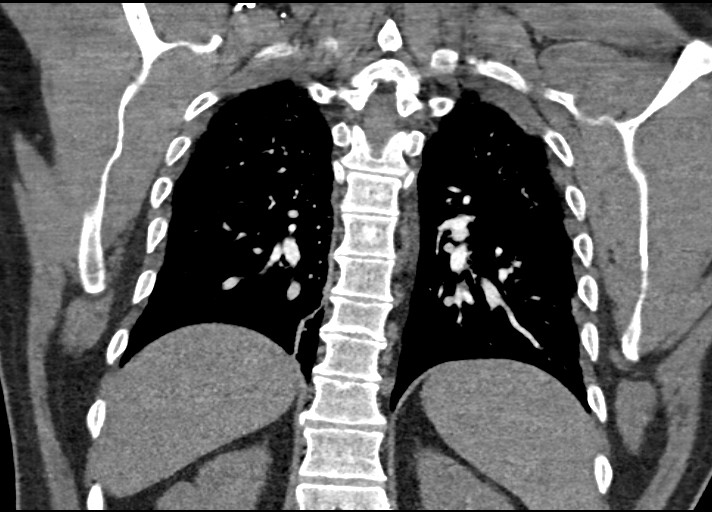

[18 of 46 positions shown; findings below may reference images not displayed]

FINDINGS: Enhanced pulmonary arteries appear patent. No significant filling
defect or pulmonary embolus by CTA. Mild cardiac enlargement. No
pericardial or pleural effusion. No adenopathy. Coronary
calcifications noted.

Included upper abdomen demonstrates vicarious contrast excretion in
the gallbladder dependently. No acute upper abdominal finding.

Lung windows demonstrate overall low lung volumes with bibasilar
atelectasis. No acute airspace process, collapse or consolidation.
No edema pattern or CHF. Negative for pneumothorax. Trachea and
central airways appear patent.

Degenerative changes noted of the thoracic spine.

Review of the MIP images confirms the above findings.
IMPRESSION: No significant acute pulmonary embolus by CTA.

Low volume chest exam with bibasilar atelectasis

## 2014-12-22 IMAGING — CR DG CHEST 2V
2 series · 2 of 2 positions shown · non-contrast
Comparison: None

CLINICAL DATA: Syncopal episode 2-3 days in a row

EXAM:
CHEST  2 VIEW

[w chest lat]
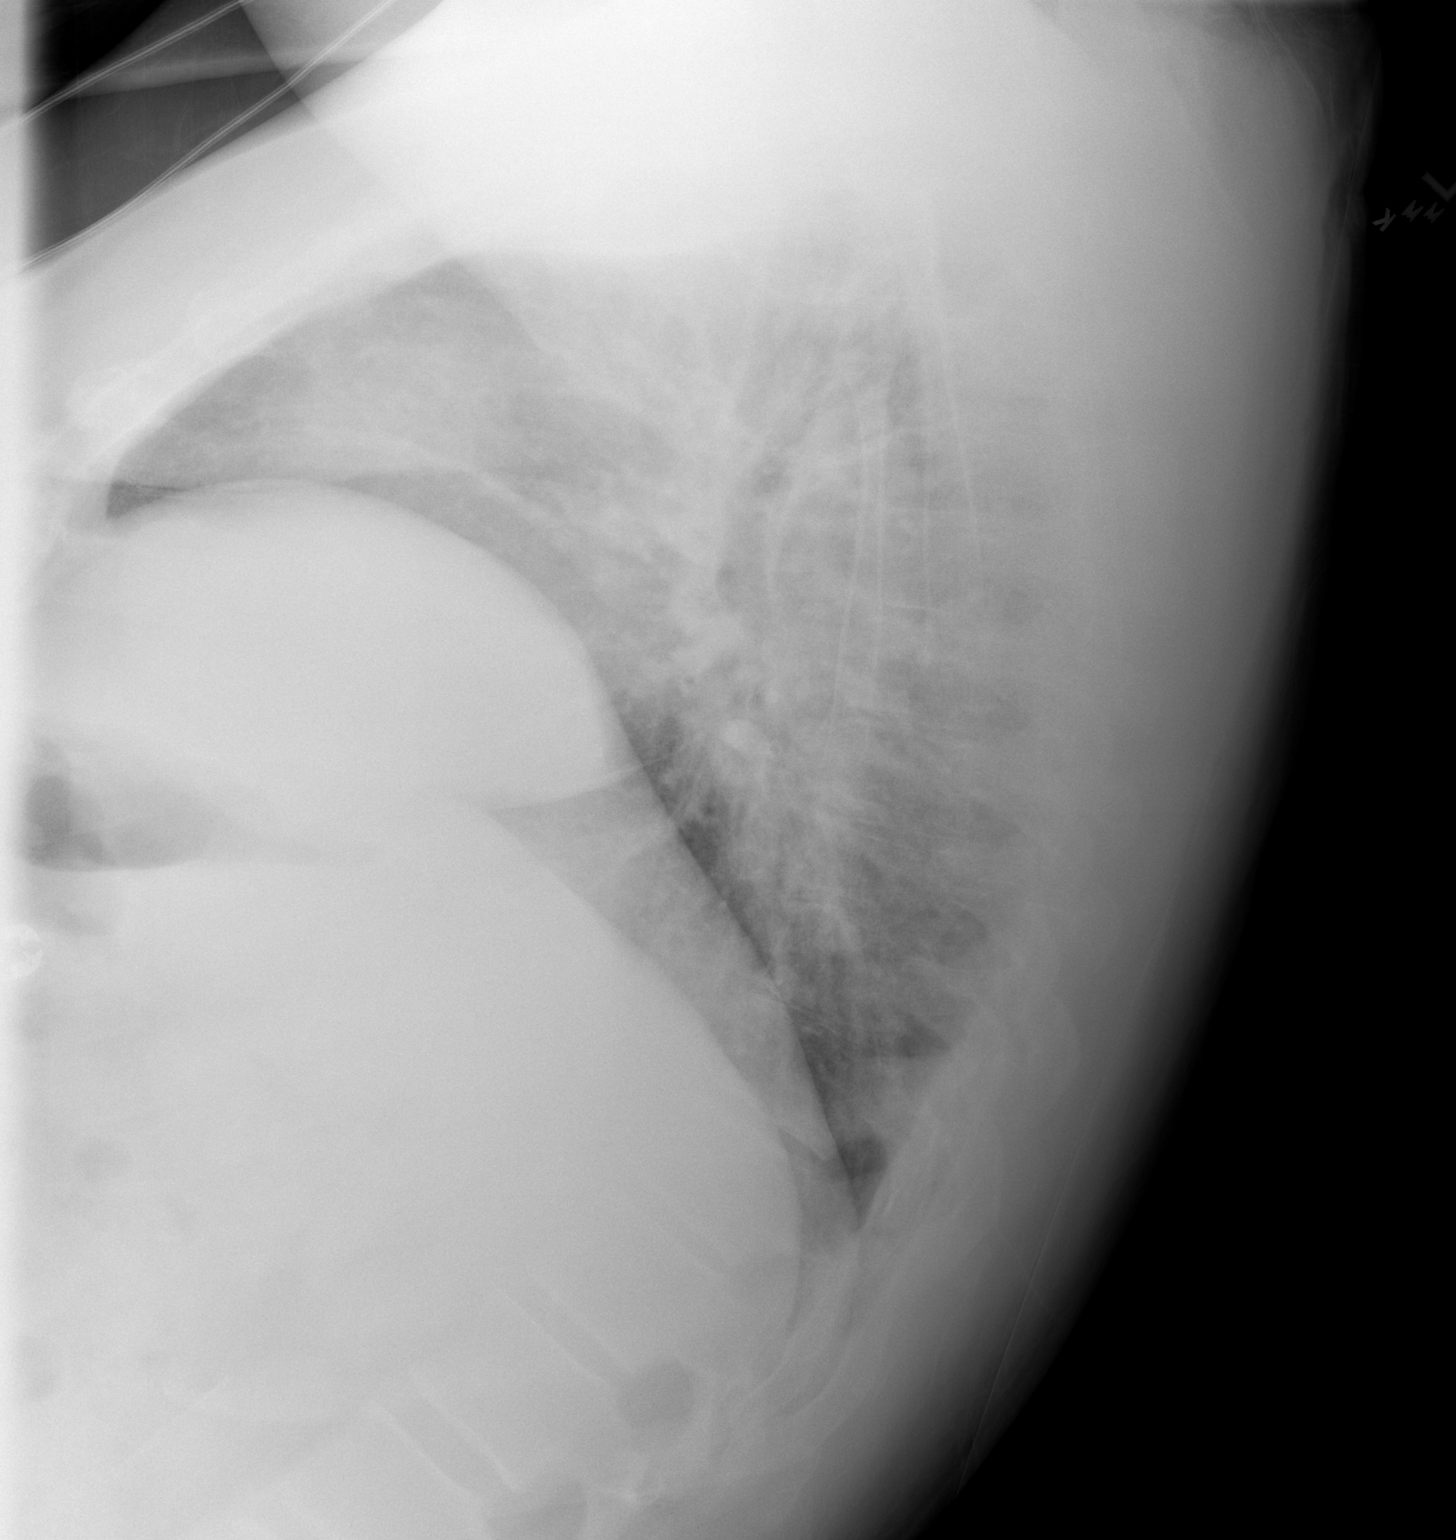

[w chest pa]
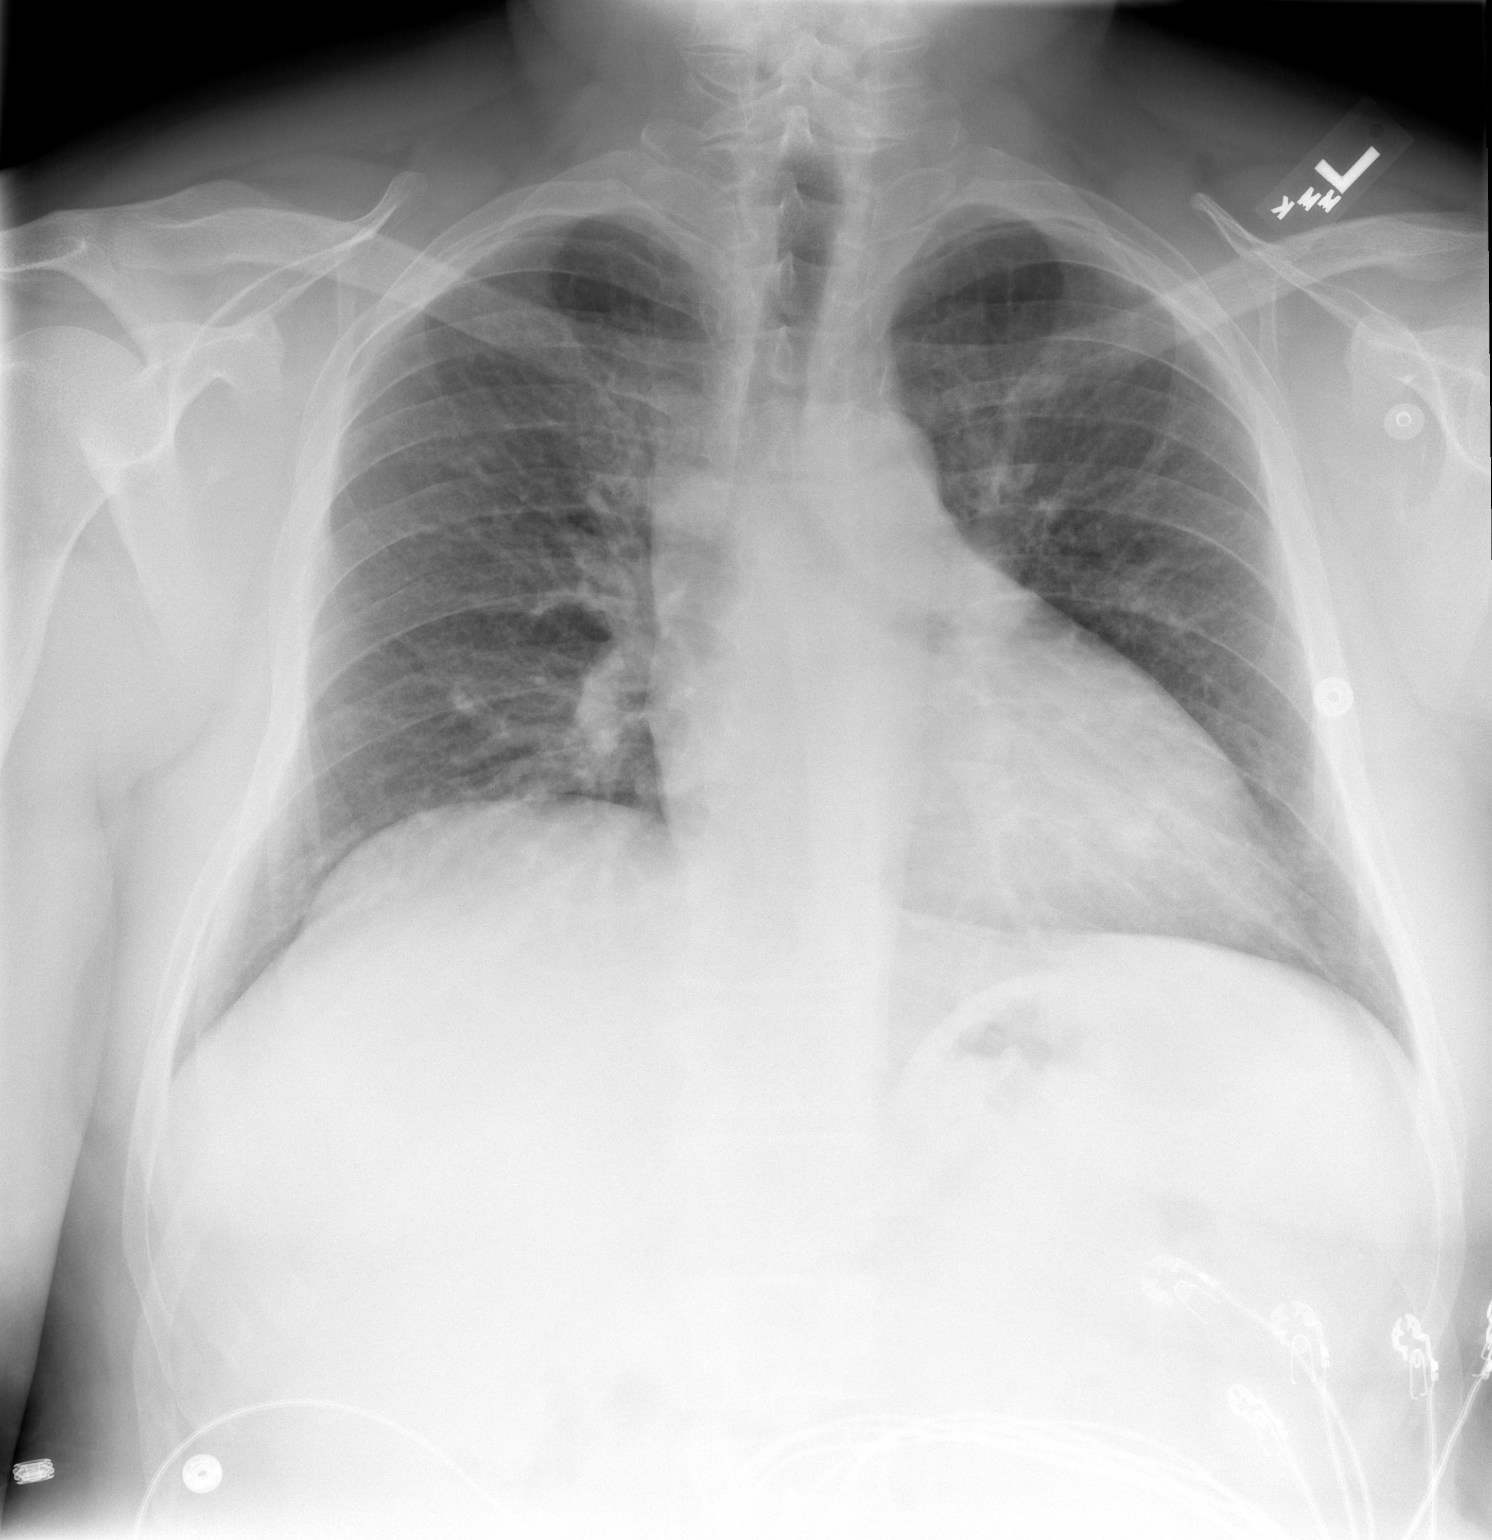

[2 of 2 positions shown; findings below may reference images not displayed]

FINDINGS: Limited inspiratory effect with heart size upper normal to mildly
enlarged. The vascular pattern is normal. Lungs are clear. No
pleural effusion.
IMPRESSION: No acute abnormalities
